# Patient Record
Sex: Male | Born: 1965 | Race: White | Hispanic: No | Marital: Married | Smoking: Never smoker
Health system: Southern US, Community
[De-identification: ages and names within clinical notes are randomized; demographics above are authoritative.]

## PROBLEM LIST (undated history)

## (undated) DIAGNOSIS — E785 Hyperlipidemia, unspecified: Secondary | ICD-10-CM

## (undated) DIAGNOSIS — I2510 Atherosclerotic heart disease of native coronary artery without angina pectoris: Secondary | ICD-10-CM

## (undated) DIAGNOSIS — N400 Enlarged prostate without lower urinary tract symptoms: Secondary | ICD-10-CM

## (undated) DIAGNOSIS — Z8489 Family history of other specified conditions: Secondary | ICD-10-CM

## (undated) DIAGNOSIS — L309 Dermatitis, unspecified: Secondary | ICD-10-CM

## (undated) DIAGNOSIS — F329 Major depressive disorder, single episode, unspecified: Secondary | ICD-10-CM

## (undated) DIAGNOSIS — F419 Anxiety disorder, unspecified: Secondary | ICD-10-CM

## (undated) DIAGNOSIS — N419 Inflammatory disease of prostate, unspecified: Secondary | ICD-10-CM

## (undated) DIAGNOSIS — K219 Gastro-esophageal reflux disease without esophagitis: Secondary | ICD-10-CM

## (undated) DIAGNOSIS — F32A Depression, unspecified: Secondary | ICD-10-CM

## (undated) DIAGNOSIS — J45909 Unspecified asthma, uncomplicated: Secondary | ICD-10-CM

## (undated) HISTORY — PX: CORONARY ANGIOPLASTY: SHX604

## (undated) HISTORY — DX: Unspecified asthma, uncomplicated: J45.909

## (undated) HISTORY — PX: CORONARY ANGIOPLASTY WITH STENT PLACEMENT: SHX49

## (undated) HISTORY — PX: VASECTOMY: SHX75

## (undated) HISTORY — PX: WISDOM TOOTH EXTRACTION: SHX21

## (undated) HISTORY — PX: ESOPHAGOGASTRODUODENOSCOPY: SHX1529

---

## 2013-08-15 ENCOUNTER — Emergency Department (HOSPITAL_BASED_OUTPATIENT_CLINIC_OR_DEPARTMENT_OTHER): Payer: Managed Care, Other (non HMO)

## 2013-08-15 ENCOUNTER — Emergency Department (HOSPITAL_BASED_OUTPATIENT_CLINIC_OR_DEPARTMENT_OTHER)
Admission: EM | Admit: 2013-08-15 | Discharge: 2013-08-16 | Disposition: A | Discharge status: Home / Self Care | Payer: Managed Care, Other (non HMO) | Attending: Emergency Medicine | Admitting: Emergency Medicine

## 2013-08-15 ENCOUNTER — Encounter (HOSPITAL_BASED_OUTPATIENT_CLINIC_OR_DEPARTMENT_OTHER): Payer: Self-pay | Admitting: Emergency Medicine

## 2013-08-15 DIAGNOSIS — R109 Unspecified abdominal pain: Secondary | ICD-10-CM

## 2013-08-15 DIAGNOSIS — IMO0001 Reserved for inherently not codable concepts without codable children: Secondary | ICD-10-CM | POA: Insufficient documentation

## 2013-08-15 DIAGNOSIS — Z87448 Personal history of other diseases of urinary system: Secondary | ICD-10-CM | POA: Insufficient documentation

## 2013-08-15 DIAGNOSIS — E785 Hyperlipidemia, unspecified: Secondary | ICD-10-CM | POA: Insufficient documentation

## 2013-08-15 DIAGNOSIS — Z7982 Long term (current) use of aspirin: Secondary | ICD-10-CM | POA: Insufficient documentation

## 2013-08-15 DIAGNOSIS — M7918 Myalgia, other site: Secondary | ICD-10-CM

## 2013-08-15 DIAGNOSIS — Z79899 Other long term (current) drug therapy: Secondary | ICD-10-CM | POA: Insufficient documentation

## 2013-08-15 HISTORY — DX: Benign prostatic hyperplasia without lower urinary tract symptoms: N40.0

## 2013-08-15 HISTORY — DX: Hyperlipidemia, unspecified: E78.5

## 2013-08-15 LAB — URINALYSIS, ROUTINE W REFLEX MICROSCOPIC
Glucose, UA: NEGATIVE mg/dL
Ketones, ur: NEGATIVE mg/dL
Leukocytes, UA: NEGATIVE
Nitrite: NEGATIVE
Protein, ur: NEGATIVE mg/dL
Specific Gravity, Urine: 1.007 (ref 1.005–1.030)
pH: 7 (ref 5.0–8.0)

## 2013-08-15 MED ORDER — SODIUM CHLORIDE 0.9 % IV BOLUS (SEPSIS)
1000.0000 mL | Freq: Once | INTRAVENOUS | Status: AC
  Administered 2013-08-15: 1000 mL via INTRAVENOUS

## 2013-08-15 MED ORDER — MORPHINE SULFATE 4 MG/ML IJ SOLN
4.0000 mg | Freq: Once | INTRAMUSCULAR | Status: AC
  Administered 2013-08-16: 4 mg via INTRAVENOUS
  Filled 2013-08-15: qty 1

## 2013-08-15 NOTE — ED Notes (Signed)
Pt c/o right flank and right sided back pain x 1 week. Pt also reports "a bulge" on right side of abd.

## 2013-08-15 NOTE — ED Provider Notes (Addendum)
CSN: [id number]     Arrival date & time 08/15/13  2153 History  HPI Comments: This chart was scribed for Ryan Kaplan, MD by Clydene Laming, ED Scribe. This patient was seen in room MH07/MH07 and the patient's care was started at 11:34 PM.  Chief Complaint  Patient presents with  . Flank Pain  . Abdominal Pain    The history is provided by the patient. No language interpreter was used.   HPI Comments: Ryan Baker is a 47 y.o. male who presents to the Emergency Department complaining of back pain with associated flank pain onset one week ago. The pain is right sided ,constant x few days, with no association to food intake. He reports a bulge on the right side of the abdomen starting yday. Pt denies any previous medical problems. No uti like sx, no hx of renal stones, abd surgeries. No associated numbness, weakness, urinary incontinence, urinary retention, bowel incontinence, saddle anesthesia, does have some right buttock tingling.    Past Medical History  Diagnosis Date  . Enlarged prostate   . Hyperlipemia    Past Surgical History  Procedure Laterality Date  . Coronary angioplasty with stent placement    . Vasectomy     No family history on file. History  Substance Use Topics  . Smoking status: Never Smoker   . Smokeless tobacco: Not on file  . Alcohol Use: Yes    Review of Systems  Constitutional: Negative for fever and chills.  Gastrointestinal: Positive for abdominal pain.  All other systems reviewed and are negative.    Allergies  Flomax  Home Medications   Current Outpatient Rx  Name  Route  Sig  Dispense  Refill  . aspirin 81 MG tablet   Oral   Take 81 mg by mouth daily.         . clonazePAM (KLONOPIN) 1 MG tablet   Oral   Take 1 mg by mouth 2 (two) times daily as needed for anxiety.         . cyclobenzaprine (FLEXERIL) 10 MG tablet   Oral   Take 10 mg by mouth 3 (three) times daily as needed for muscle spasms.         Marland Kitchen lamoTRIgine (LAMICTAL) 100  MG tablet   Oral   Take 125 mg by mouth daily.         . rosuvastatin (CRESTOR) 5 MG tablet   Oral   Take 5 mg by mouth daily.         . traMADol (ULTRAM) 50 MG tablet   Oral   Take 50 mg by mouth every 6 (six) hours as needed for pain.          Triage Vitals: BP 148/88  Pulse 68  Temp(Src) 99.2 F (37.3 C) (Oral)  Resp 18  Ht 5\' 9"  (1.753 m)  Wt 158 lb (71.668 kg)  BMI 23.32 kg/m2  SpO2 100% Physical Exam  Nursing note and vitals reviewed. Constitutional: He appears well-developed and well-nourished.  HENT:  Head: Normocephalic and atraumatic.  Mouth/Throat: Oropharynx is clear and moist.  Eyes: EOM are normal. Pupils are equal, round, and reactive to light.  Neck: Normal range of motion. Neck supple.  Cardiovascular: Normal rate, regular rhythm and normal heart sounds.   Pulmonary/Chest: Effort normal and breath sounds normal. He has no wheezes.  Abdominal: There is tenderness (Mild to palpation). There is no rebound and no guarding.  No flank tenderness with paplpation Mild protrusion to right side below  the umbilical region  Genitourinary: Penis normal.  Pt has no testicular tenderness, mass, no signs of hernia.  Musculoskeletal: Normal range of motion.  No midline spine tenderness   Neurological: He is alert.  Skin: Skin is warm and dry. No rash noted. No erythema.  No sign of hernia  Psychiatric: He has a normal mood and affect. His behavior is normal.    ED Course  Procedures (including critical care time) DIAGNOSTIC STUDIES: Oxygen Saturation is 100% on RA, normal by my interpretation.    COORDINATION OF CARE: 11;40 PM- Discussed treatment plan with pt at bedside. Pt verbalized understanding and agreement with plan.   Labs Review Labs Reviewed  URINALYSIS, ROUTINE W REFLEX MICROSCOPIC   Imaging Review No results found.  MDM  No diagnosis found. I personally performed the services described in this documentation, which was scribed in my  presence. The recorded information has been reviewed and is accurate.  Pt comes in with cc of abd pain, and bulging. He is being treated as musculoskeletal pain. Pain has persistent, now he has a bulge- with no clear signs of hernia to the right side. Could be partial muscle tear, but i am not sure. We will get basic labs and CT - which if notmal, he is to continue his PT.  The GU exam is normal. The back exam is normal, no concerns for even sciatica.  1:58 AM CT normal  -will d.c.  Ryan Kaplan, MD 08/16/13 1610  Ryan Kaplan, MD 08/16/13 0201

## 2013-08-15 NOTE — ED Notes (Signed)
MD at bedside. 

## 2013-08-16 LAB — CBC WITH DIFFERENTIAL/PLATELET
Basophils Relative: 0 % (ref 0–1)
HCT: 41.7 % (ref 39.0–52.0)
Hemoglobin: 15.4 g/dL (ref 13.0–17.0)
Lymphocytes Relative: 32 % (ref 12–46)
Lymphs Abs: 2.7 10*3/uL (ref 0.7–4.0)
MCHC: 36.9 g/dL — ABNORMAL HIGH (ref 30.0–36.0)
Monocytes Absolute: 1 10*3/uL (ref 0.1–1.0)
Monocytes Relative: 11 % (ref 3–12)
Neutro Abs: 4.7 10*3/uL (ref 1.7–7.7)
Neutrophils Relative %: 56 % (ref 43–77)
Platelets: 219 10*3/uL (ref 150–400)
RBC: 4.79 MIL/uL (ref 4.22–5.81)
WBC: 8.4 10*3/uL (ref 4.0–10.5)

## 2013-08-16 LAB — BASIC METABOLIC PANEL
BUN: 14 mg/dL (ref 6–23)
CO2: 30 mEq/L (ref 19–32)
Chloride: 93 mEq/L — ABNORMAL LOW (ref 96–112)
Creatinine, Ser: 1 mg/dL (ref 0.50–1.35)
GFR calc Af Amer: >90 mL/min (ref >90)
GFR calc non Af Amer: 87 mL/min — ABNORMAL LOW (ref >90)
Glucose, Bld: 95 mg/dL (ref 70–99)
Potassium: 4.1 mEq/L (ref 3.5–5.1)

## 2013-08-16 MED ORDER — IOHEXOL 300 MG/ML  SOLN
100.0000 mL | Freq: Once | INTRAMUSCULAR | Status: AC | PRN
  Administered 2013-08-16: 100 mL via INTRAVENOUS

## 2013-08-16 MED ORDER — IOHEXOL 300 MG/ML  SOLN
50.0000 mL | Freq: Once | INTRAMUSCULAR | Status: AC | PRN
  Administered 2013-08-16: 50 mL via ORAL

## 2013-08-16 MED ORDER — IBUPROFEN 600 MG PO TABS: 600.0000 mg | ORAL_TABLET | Freq: Four times a day (QID) | ORAL | Status: AC | PRN

## 2013-08-16 NOTE — ED Notes (Signed)
MD at bedside discussing test results and plan of care for DC. 

## 2013-08-16 NOTE — ED Notes (Signed)
Patient transported to CT 

## 2013-11-13 DIAGNOSIS — M259 Joint disorder, unspecified: Secondary | ICD-10-CM | POA: Insufficient documentation

## 2014-03-26 DIAGNOSIS — J45901 Unspecified asthma with (acute) exacerbation: Secondary | ICD-10-CM | POA: Insufficient documentation

## 2014-05-14 DIAGNOSIS — G4700 Insomnia, unspecified: Secondary | ICD-10-CM | POA: Insufficient documentation

## 2014-06-09 DIAGNOSIS — K5900 Constipation, unspecified: Secondary | ICD-10-CM | POA: Insufficient documentation

## 2014-06-09 DIAGNOSIS — M75100 Unspecified rotator cuff tear or rupture of unspecified shoulder, not specified as traumatic: Secondary | ICD-10-CM | POA: Insufficient documentation

## 2014-06-09 DIAGNOSIS — I2510 Atherosclerotic heart disease of native coronary artery without angina pectoris: Secondary | ICD-10-CM | POA: Insufficient documentation

## 2014-06-09 DIAGNOSIS — R0602 Shortness of breath: Secondary | ICD-10-CM | POA: Insufficient documentation

## 2014-06-09 DIAGNOSIS — F419 Anxiety disorder, unspecified: Secondary | ICD-10-CM | POA: Insufficient documentation

## 2014-06-09 DIAGNOSIS — R5383 Other fatigue: Secondary | ICD-10-CM | POA: Insufficient documentation

## 2014-06-09 DIAGNOSIS — N401 Enlarged prostate with lower urinary tract symptoms: Secondary | ICD-10-CM | POA: Insufficient documentation

## 2014-06-09 DIAGNOSIS — F329 Major depressive disorder, single episode, unspecified: Secondary | ICD-10-CM | POA: Insufficient documentation

## 2014-06-09 DIAGNOSIS — E785 Hyperlipidemia, unspecified: Secondary | ICD-10-CM | POA: Insufficient documentation

## 2014-06-09 DIAGNOSIS — F988 Other specified behavioral and emotional disorders with onset usually occurring in childhood and adolescence: Secondary | ICD-10-CM | POA: Insufficient documentation

## 2014-06-09 DIAGNOSIS — R350 Frequency of micturition: Secondary | ICD-10-CM | POA: Insufficient documentation

## 2014-06-09 DIAGNOSIS — F411 Generalized anxiety disorder: Secondary | ICD-10-CM | POA: Insufficient documentation

## 2014-06-09 DIAGNOSIS — N400 Enlarged prostate without lower urinary tract symptoms: Secondary | ICD-10-CM | POA: Insufficient documentation

## 2014-06-09 DIAGNOSIS — B351 Tinea unguium: Secondary | ICD-10-CM | POA: Insufficient documentation

## 2014-06-09 DIAGNOSIS — J309 Allergic rhinitis, unspecified: Secondary | ICD-10-CM | POA: Insufficient documentation

## 2014-06-09 DIAGNOSIS — G9001 Carotid sinus syncope: Secondary | ICD-10-CM | POA: Insufficient documentation

## 2014-06-09 DIAGNOSIS — E291 Testicular hypofunction: Secondary | ICD-10-CM | POA: Insufficient documentation

## 2014-06-09 DIAGNOSIS — K219 Gastro-esophageal reflux disease without esophagitis: Secondary | ICD-10-CM | POA: Insufficient documentation

## 2014-08-07 NOTE — H&P (Signed)
  Ryan Baker is an 48 y.o. male.    Chief Complaint: left shoulder  HPI: Pt is a 48 y.o. male complaining of left shoulder pain for multiple years. Pain had continually increased since the beginning. X-rays in the clinic show end-stage arthritic changes of the left AC joint. Pt has tried various conservative treatments which have failed to alleviate their symptoms, including medications and therapy. Various options are discussed with the patient. Risks, benefits and expectations were discussed with the patient. Patient understand the risks, benefits and expectations and wishes to proceed with surgery.   PCP:  No primary provider on file.  D/C Plans:  Home  PMH: No past medical history on file.  PSH: No past surgical history on file.  Social History:  has no tobacco, alcohol, and drug history on file.  Allergies:  Allergies not on file  Medications: No current facility-administered medications for this encounter.   No current outpatient prescriptions on file.    No results found for this or any previous visit (from the past 48 hour(s)). No results found.  ROS: Pain with rom of the left upper extremity  Physical Exam:  Alert and oriented 48 y.o. male in no acute distress Cranial nerves 2-12 intact Cervical spine: full rom with no tenderness, nv intact distally Chest: active breath sounds bilaterally, no wheeze rhonchi or rales Heart: regular rate and rhythm, no murmur Abd: non tender non distended with active bowel sounds Hip is stable with rom  Left shoulder with moderate AC joint tenderness Strength of ER and IR 5/5 bilaterally No rashes or edema Good rom otherwise  Assessment/Plan Assessment: left shoulder AC arthrosis  Plan: Patient will undergo a left shoulder scope with open distal clavicle excision by Dr. Ranell Patrick at [facility]. Risks benefits and expectations were discussed with the patient. Patient understand risks, benefits and expectations and wishes  to proceed.

## 2014-08-14 ENCOUNTER — Encounter (HOSPITAL_COMMUNITY)
Admission: RE | Admit: 2014-08-14 | Discharge: 2014-08-14 | Disposition: A | Discharge status: Home / Self Care | Payer: Managed Care, Other (non HMO) | Source: Ambulatory Visit | Attending: Orthopedic Surgery | Admitting: Orthopedic Surgery

## 2014-08-14 ENCOUNTER — Encounter (HOSPITAL_COMMUNITY): Payer: Self-pay

## 2014-08-14 ENCOUNTER — Encounter (HOSPITAL_COMMUNITY): Payer: Self-pay | Admitting: Pharmacy Technician

## 2014-08-14 ENCOUNTER — Ambulatory Visit (HOSPITAL_COMMUNITY)
Admission: RE | Admit: 2014-08-14 | Discharge: 2014-08-14 | Disposition: A | Discharge status: Home / Self Care | Payer: Managed Care, Other (non HMO) | Source: Ambulatory Visit | Attending: Anesthesiology | Admitting: Anesthesiology

## 2014-08-14 DIAGNOSIS — Z01818 Encounter for other preprocedural examination: Secondary | ICD-10-CM

## 2014-08-14 DIAGNOSIS — M25819 Other specified joint disorders, unspecified shoulder: Secondary | ICD-10-CM | POA: Diagnosis not present

## 2014-08-14 DIAGNOSIS — I2510 Atherosclerotic heart disease of native coronary artery without angina pectoris: Secondary | ICD-10-CM | POA: Diagnosis not present

## 2014-08-14 DIAGNOSIS — M19019 Primary osteoarthritis, unspecified shoulder: Secondary | ICD-10-CM | POA: Diagnosis not present

## 2014-08-14 HISTORY — DX: Family history of other specified conditions: Z84.89

## 2014-08-14 HISTORY — DX: Gastro-esophageal reflux disease without esophagitis: K21.9

## 2014-08-14 HISTORY — DX: Anxiety disorder, unspecified: F41.9

## 2014-08-14 HISTORY — DX: Atherosclerotic heart disease of native coronary artery without angina pectoris: I25.10

## 2014-08-14 HISTORY — DX: Major depressive disorder, single episode, unspecified: F32.9

## 2014-08-14 HISTORY — DX: Inflammatory disease of prostate, unspecified: N41.9

## 2014-08-14 HISTORY — DX: Dermatitis, unspecified: L30.9

## 2014-08-14 HISTORY — DX: Depression, unspecified: F32.A

## 2014-08-14 LAB — CBC
HEMATOCRIT: 39.7 % (ref 39.0–52.0)
HEMOGLOBIN: 14.4 g/dL (ref 13.0–17.0)
MCH: 31.8 pg (ref 26.0–34.0)
MCHC: 36.3 g/dL — AB (ref 30.0–36.0)
MCV: 87.6 fL (ref 78.0–100.0)
Platelets: 184 10*3/uL (ref 150–400)
RBC: 4.53 MIL/uL (ref 4.22–5.81)
RDW: 12 % (ref 11.5–15.5)
WBC: 5.6 10*3/uL (ref 4.0–10.5)

## 2014-08-14 LAB — BASIC METABOLIC PANEL
ANION GAP: 11 (ref 5–15)
BUN: 10 mg/dL (ref 6–23)
CO2: 26 meq/L (ref 19–32)
CREATININE: 0.94 mg/dL (ref 0.50–1.35)
Calcium: 9.4 mg/dL (ref 8.4–10.5)
Chloride: 100 mEq/L (ref 96–112)
GFR calc non Af Amer: >90 mL/min (ref >90)
Glucose, Bld: 108 mg/dL — ABNORMAL HIGH (ref 70–99)
POTASSIUM: 3.8 meq/L (ref 3.7–5.3)
SODIUM: 137 meq/L (ref 137–147)

## 2014-08-14 MED ORDER — CEFAZOLIN SODIUM-DEXTROSE 2-3 GM-% IV SOLR
2.0000 g | INTRAVENOUS | Status: AC
  Administered 2014-08-15: 2 g via INTRAVENOUS
  Filled 2014-08-14: qty 50

## 2014-08-14 NOTE — Pre-Procedure Instructions (Signed)
Ryan Baker  08/14/2014   Your procedure is scheduled on: Friday, August 15, 2014  Report to [facility] Admitting at 11:30 AM.  Call this number if you have problems the morning of surgery: [phone]   Remember:   Do not eat food or drink liquids after midnight.   Take these medicines the morning of surgery with A SIP OF WATER: fexofenadine (ALLEGRA),  mometasone (NASONEX) nasal spray, if needed: alfuzosin (UROXATRAL) for symptoms of prostatitis Stop taking Aspirin, vitamins, and herbal medications. Do not take any NSAIDs ie: Ibuprofen, Advil, Naproxen or any medication containing Aspirin.  Do not wear jewelry, make-up or nail polish.  Do not wear lotions, powders, or perfumes. You may not wear deodorant.  Do not shave 48 hours prior to surgery. Men may shave face and neck.  Do not bring valuables to the hospital.  [facility] is not responsible for any belongings or valuables.               Contacts, dentures or bridgework may not be worn into surgery.  Leave suitcase in the car. After surgery it may be brought to your room.  For patients admitted to the hospital, discharge time is determined by your treatment team.               Patients discharged the day of surgery will not be allowed to drive home.  Name and phone number of your driver:   Special Instructions:  Special Instructions:Special Instructions: [facility] - Preparing for Surgery  Before surgery, you can play an important role.  Because skin is not sterile, your skin needs to be as free of germs as possible.  You can reduce the number of germs on you skin by washing with CHG (chlorahexidine gluconate) soap before surgery.  CHG is an antiseptic cleaner which kills germs and bonds with the skin to continue killing germs even after washing.  Please DO NOT use if you have an allergy to CHG or antibacterial soaps.  If your skin becomes reddened/irritated stop using the CHG and inform your nurse when you  arrive at Short Stay.  Do not shave (including legs and underarms) for at least 48 hours prior to the first CHG shower.  You may shave your face.  Please follow these instructions carefully:   1.  Shower with CHG Soap the night before surgery and the morning of Surgery.  2.  If you choose to wash your hair, wash your hair first as usual with your normal shampoo.  3.  After you shampoo, rinse your hair and body thoroughly to remove the Shampoo.  4.  Use CHG as you would any other liquid soap.  You can apply chg directly  to the skin and wash gently with scrungie or a clean washcloth.  5.  Apply the CHG Soap to your body ONLY FROM THE NECK DOWN.  Do not use on open wounds or open sores.  Avoid contact with your eyes, ears, mouth and genitals (private parts).  Wash genitals (private parts) with your normal soap.  6.  Wash thoroughly, paying special attention to the area where your surgery will be performed.  7.  Thoroughly rinse your body with warm water from the neck down.  8.  DO NOT shower/wash with your normal soap after using and rinsing off the CHG Soap.  9.  Pat yourself dry with a clean towel.            10.  Wear  clean pajamas.            11.  Place clean sheets on your bed the night of your first shower and do not sleep with pets.  Day of Surgery  Do not apply any lotions/deodorants the morning of surgery.  Please wear clean clothes to the hospital/surgery center.   Please read over the following fact sheets that you were given: Pain Booklet, Coughing and Deep Breathing and Surgical Site Infection Prevention

## 2014-08-14 NOTE — Progress Notes (Signed)
Pt denies SOB and chest pain. Pt stated that he had an EKG, stress and echo this summer and a cardiac cath in the previous year. Records requested from [facility], Dr. Rhona Leavens. Pt also stated that he suffers from hypoglycemia and dizziness if he goes without eating for a long period of time. Pt wanted to see if MD would make a special provision regarding his diet. Pt instructed to be NPO after MN. Spoke with Donia Guiles at surgeons office to make MD aware of pt request.

## 2014-08-15 ENCOUNTER — Encounter (HOSPITAL_COMMUNITY): Payer: Managed Care, Other (non HMO) | Admitting: Emergency Medicine

## 2014-08-15 ENCOUNTER — Encounter (HOSPITAL_COMMUNITY)
Admission: RE | Disposition: A | Discharge status: Home / Self Care | Payer: Self-pay | Source: Ambulatory Visit | Attending: Orthopedic Surgery

## 2014-08-15 ENCOUNTER — Encounter (HOSPITAL_COMMUNITY): Payer: Self-pay | Admitting: *Deleted

## 2014-08-15 ENCOUNTER — Ambulatory Visit (HOSPITAL_COMMUNITY): Payer: Managed Care, Other (non HMO) | Admitting: Anesthesiology

## 2014-08-15 ENCOUNTER — Ambulatory Visit (HOSPITAL_COMMUNITY)
Admission: RE | Admit: 2014-08-15 | Discharge: 2014-08-15 | Disposition: A | Discharge status: Home / Self Care | Payer: Managed Care, Other (non HMO) | Source: Ambulatory Visit | Attending: Orthopedic Surgery | Admitting: Orthopedic Surgery

## 2014-08-15 DIAGNOSIS — M19019 Primary osteoarthritis, unspecified shoulder: Secondary | ICD-10-CM | POA: Diagnosis not present

## 2014-08-15 DIAGNOSIS — M7580 Other shoulder lesions, unspecified shoulder: Secondary | ICD-10-CM

## 2014-08-15 DIAGNOSIS — M25819 Other specified joint disorders, unspecified shoulder: Secondary | ICD-10-CM | POA: Insufficient documentation

## 2014-08-15 DIAGNOSIS — I2510 Atherosclerotic heart disease of native coronary artery without angina pectoris: Secondary | ICD-10-CM | POA: Insufficient documentation

## 2014-08-15 HISTORY — PX: SHOULDER ARTHROSCOPY: SHX128

## 2014-08-15 SURGERY — ARTHROSCOPY, SHOULDER
Anesthesia: General | Site: Shoulder | Laterality: Left

## 2014-08-15 MED ORDER — FENTANYL CITRATE 0.05 MG/ML IJ SOLN
INTRAMUSCULAR | Status: AC
  Administered 2014-08-15: 100 ug via INTRAVENOUS
  Filled 2014-08-15: qty 2

## 2014-08-15 MED ORDER — METHOCARBAMOL 500 MG PO TABS: 500.0000 mg | ORAL_TABLET | Freq: Three times a day (TID) | ORAL | Status: DC | PRN

## 2014-08-15 MED ORDER — MIDAZOLAM HCL 2 MG/2ML IJ SOLN
INTRAMUSCULAR | Status: AC
  Filled 2014-08-15: qty 2

## 2014-08-15 MED ORDER — LIDOCAINE HCL (CARDIAC) 20 MG/ML IV SOLN
INTRAVENOUS | Status: AC
  Filled 2014-08-15: qty 5

## 2014-08-15 MED ORDER — GLYCOPYRROLATE 0.2 MG/ML IJ SOLN
INTRAMUSCULAR | Status: AC
  Filled 2014-08-15: qty 2

## 2014-08-15 MED ORDER — FENTANYL CITRATE 0.05 MG/ML IJ SOLN
INTRAMUSCULAR | Status: DC | PRN
  Administered 2014-08-15: 125 ug via INTRAVENOUS

## 2014-08-15 MED ORDER — NEOSTIGMINE METHYLSULFATE 10 MG/10ML IV SOLN
INTRAVENOUS | Status: DC | PRN
  Administered 2014-08-15: 2 mg via INTRAVENOUS

## 2014-08-15 MED ORDER — LIDOCAINE HCL (CARDIAC) 20 MG/ML IV SOLN
INTRAVENOUS | Status: DC | PRN
  Administered 2014-08-15: 20 mg via INTRAVENOUS

## 2014-08-15 MED ORDER — SUCCINYLCHOLINE CHLORIDE 20 MG/ML IJ SOLN
INTRAMUSCULAR | Status: DC | PRN
  Administered 2014-08-15: 60 mg via INTRAVENOUS

## 2014-08-15 MED ORDER — SCOPOLAMINE 1 MG/3DAYS TD PT72
1.0000 | MEDICATED_PATCH | TRANSDERMAL | Status: DC
  Administered 2014-08-15: 1.5 mg via TRANSDERMAL
  Filled 2014-08-15: qty 1

## 2014-08-15 MED ORDER — SODIUM CHLORIDE 0.9 % IR SOLN
Status: DC | PRN
  Administered 2014-08-15: 1000 mL
  Administered 2014-08-15 (×2): 3000 mL

## 2014-08-15 MED ORDER — PROPOFOL 10 MG/ML IV BOLUS
INTRAVENOUS | Status: AC
Start: 2014-08-15 — End: 2014-08-15
  Filled 2014-08-15: qty 20

## 2014-08-15 MED ORDER — OXYCODONE-ACETAMINOPHEN 5-325 MG PO TABS: 1.0000 | ORAL_TABLET | ORAL | Status: DC | PRN

## 2014-08-15 MED ORDER — ONDANSETRON HCL 4 MG/2ML IJ SOLN
INTRAMUSCULAR | Status: AC
  Filled 2014-08-15: qty 2

## 2014-08-15 MED ORDER — PHENYLEPHRINE HCL 10 MG/ML IJ SOLN
INTRAMUSCULAR | Status: AC
  Filled 2014-08-15: qty 1

## 2014-08-15 MED ORDER — KETOROLAC TROMETHAMINE 30 MG/ML IJ SOLN
30.0000 mg | Freq: Once | INTRAMUSCULAR | Status: AC
  Administered 2014-08-15: 30 mg via INTRAVENOUS

## 2014-08-15 MED ORDER — BUPIVACAINE-EPINEPHRINE 0.25% -1:200000 IJ SOLN
INTRAMUSCULAR | Status: DC | PRN
  Administered 2014-08-15: 5 mL

## 2014-08-15 MED ORDER — CHLORHEXIDINE GLUCONATE 4 % EX LIQD
60.0000 mL | Freq: Once | CUTANEOUS | Status: DC
  Filled 2014-08-15: qty 60

## 2014-08-15 MED ORDER — KETOROLAC TROMETHAMINE 30 MG/ML IJ SOLN
INTRAMUSCULAR | Status: AC
  Filled 2014-08-15: qty 1

## 2014-08-15 MED ORDER — ROCURONIUM BROMIDE 100 MG/10ML IV SOLN
INTRAVENOUS | Status: DC | PRN
  Administered 2014-08-15: 10 mg via INTRAVENOUS

## 2014-08-15 MED ORDER — FENTANYL CITRATE 0.05 MG/ML IJ SOLN
100.0000 ug | Freq: Once | INTRAMUSCULAR | Status: AC
  Administered 2014-08-15: 100 ug via INTRAVENOUS

## 2014-08-15 MED ORDER — FENTANYL CITRATE 0.05 MG/ML IJ SOLN
INTRAMUSCULAR | Status: AC
  Filled 2014-08-15: qty 5

## 2014-08-15 MED ORDER — BUPIVACAINE-EPINEPHRINE (PF) 0.25% -1:200000 IJ SOLN
INTRAMUSCULAR | Status: AC
  Filled 2014-08-15: qty 30

## 2014-08-15 MED ORDER — ONDANSETRON HCL 4 MG/2ML IJ SOLN: 4.0000 mg | Freq: Once | INTRAMUSCULAR | Status: DC | PRN

## 2014-08-15 MED ORDER — LACTATED RINGERS IV SOLN
Freq: Once | INTRAVENOUS | Status: AC
  Administered 2014-08-15: 13:00:00 via INTRAVENOUS

## 2014-08-15 MED ORDER — PROPOFOL 10 MG/ML IV BOLUS
INTRAVENOUS | Status: DC | PRN
  Administered 2014-08-15: 200 mg via INTRAVENOUS

## 2014-08-15 MED ORDER — MIDAZOLAM HCL 5 MG/5ML IJ SOLN
INTRAMUSCULAR | Status: DC | PRN
  Administered 2014-08-15: 2 mg via INTRAVENOUS

## 2014-08-15 MED ORDER — LIDOCAINE HCL 4 % MT SOLN
OROMUCOSAL | Status: DC | PRN
  Administered 2014-08-15: 4 mL via TOPICAL

## 2014-08-15 MED ORDER — SODIUM CHLORIDE 0.9 % IV SOLN
10.0000 mg | INTRAVENOUS | Status: DC | PRN
  Administered 2014-08-15: 10 ug/min via INTRAVENOUS

## 2014-08-15 MED ORDER — LACTATED RINGERS IV SOLN
INTRAVENOUS | Status: DC | PRN
  Administered 2014-08-15: 13:00:00 via INTRAVENOUS

## 2014-08-15 MED ORDER — GLYCOPYRROLATE 0.2 MG/ML IJ SOLN
INTRAMUSCULAR | Status: DC | PRN
  Administered 2014-08-15: 0.4 mg via INTRAVENOUS

## 2014-08-15 MED ORDER — ONDANSETRON HCL 4 MG/2ML IJ SOLN
INTRAMUSCULAR | Status: DC | PRN
  Administered 2014-08-15: 4 mg via INTRAVENOUS

## 2014-08-15 MED ORDER — HYDROMORPHONE HCL 1 MG/ML IJ SOLN: 0.2500 mg | INTRAMUSCULAR | Status: DC | PRN

## 2014-08-15 SURGICAL SUPPLY — 52 items
BLADE CUDA 4.2 (BLADE) ×3 IMPLANT
BLADE SURG 11 STRL SS (BLADE) ×3 IMPLANT
BUR OVAL 4.0 (BURR) IMPLANT
CLOSURE STERI-STRIP 1/2X4 (GAUZE/BANDAGES/DRESSINGS) ×1
CLOSURE WOUND 1/2 X4 (GAUZE/BANDAGES/DRESSINGS) ×1
CLSR STERI-STRIP ANTIMIC 1/2X4 (GAUZE/BANDAGES/DRESSINGS) ×2 IMPLANT
COVER SURGICAL LIGHT HANDLE (MISCELLANEOUS) ×3 IMPLANT
DRAPE INCISE IOBAN 66X45 STRL (DRAPES) ×3 IMPLANT
DRAPE STERI 35X30 U-POUCH (DRAPES) ×3 IMPLANT
DRAPE U-SHAPE 47X51 STRL (DRAPES) ×3 IMPLANT
DRSG ADAPTIC 3X8 NADH LF (GAUZE/BANDAGES/DRESSINGS) ×3 IMPLANT
DRSG EMULSION OIL 3X3 NADH (GAUZE/BANDAGES/DRESSINGS) ×6 IMPLANT
DRSG PAD ABDOMINAL 8X10 ST (GAUZE/BANDAGES/DRESSINGS) ×3 IMPLANT
DURAPREP 26ML APPLICATOR (WOUND CARE) ×3 IMPLANT
ELECT REM PT RETURN 9FT ADLT (ELECTROSURGICAL)
ELECTRODE REM PT RTRN 9FT ADLT (ELECTROSURGICAL) IMPLANT
GAUZE SPONGE 4X4 12PLY STRL (GAUZE/BANDAGES/DRESSINGS) ×3 IMPLANT
GLOVE BIOGEL PI ORTHO PRO 7.5 (GLOVE) ×2
GLOVE BIOGEL PI ORTHO PRO SZ8 (GLOVE) ×2
GLOVE ORTHO TXT STRL SZ7.5 (GLOVE) ×3 IMPLANT
GLOVE PI ORTHO PRO STRL 7.5 (GLOVE) ×1 IMPLANT
GLOVE PI ORTHO PRO STRL SZ8 (GLOVE) ×1 IMPLANT
GLOVE SURG ORTHO 8.5 STRL (GLOVE) ×3 IMPLANT
GOWN STRL REUS W/ TWL XL LVL3 (GOWN DISPOSABLE) ×4 IMPLANT
GOWN STRL REUS W/TWL XL LVL3 (GOWN DISPOSABLE) ×8
KIT BASIN OR (CUSTOM PROCEDURE TRAY) ×3 IMPLANT
KIT ROOM TURNOVER OR (KITS) ×3 IMPLANT
MANIFOLD NEPTUNE II (INSTRUMENTS) ×3 IMPLANT
NEEDLE HYPO 25GX1X1/2 BEV (NEEDLE) ×3 IMPLANT
NEEDLE SPNL 18GX3.5 QUINCKE PK (NEEDLE) ×3 IMPLANT
NS IRRIG 1000ML POUR BTL (IV SOLUTION) ×3 IMPLANT
PACK SHOULDER (CUSTOM PROCEDURE TRAY) ×3 IMPLANT
PAD ARMBOARD 7.5X6 YLW CONV (MISCELLANEOUS) ×6 IMPLANT
RESECTOR FULL RADIUS 4.2MM (BLADE) ×3 IMPLANT
SET ARTHROSCOPY TUBING (MISCELLANEOUS) ×2
SET ARTHROSCOPY TUBING LN (MISCELLANEOUS) ×1 IMPLANT
SLING ARM LRG ADULT FOAM STRAP (SOFTGOODS) ×3 IMPLANT
SLING ARM MED ADULT FOAM STRAP (SOFTGOODS) IMPLANT
SPONGE GAUZE 4X4 12PLY STER LF (GAUZE/BANDAGES/DRESSINGS) ×3 IMPLANT
STRIP CLOSURE SKIN 1/2X4 (GAUZE/BANDAGES/DRESSINGS) ×2 IMPLANT
SUT VIC AB 0 CT2 27 (SUTURE) IMPLANT
SUT VIC AB 2-0 CT1 27 (SUTURE) ×2
SUT VIC AB 2-0 CT1 TAPERPNT 27 (SUTURE) ×1 IMPLANT
SUT VICRYL 0 CT 1 36IN (SUTURE) ×3 IMPLANT
SYR CONTROL 10ML LL (SYRINGE) ×3 IMPLANT
TAPE CLOTH SURG 6X10 WHT LF (GAUZE/BANDAGES/DRESSINGS) ×3 IMPLANT
TOWEL OR 17X24 6PK STRL BLUE (TOWEL DISPOSABLE) ×3 IMPLANT
TOWEL OR 17X26 10 PK STRL BLUE (TOWEL DISPOSABLE) ×3 IMPLANT
TUBE CONNECTING 12'X1/4 (SUCTIONS) ×1
TUBE CONNECTING 12X1/4 (SUCTIONS) ×2 IMPLANT
WAND HAND CNTRL MULTIVAC 90 (MISCELLANEOUS) ×3 IMPLANT
WATER STERILE IRR 1000ML POUR (IV SOLUTION) ×3 IMPLANT

## 2014-08-15 NOTE — Brief Op Note (Signed)
08/15/2014  3:20 PM  PATIENT:  Ryan Baker  48 y.o. male  PRE-OPERATIVE DIAGNOSIS:  LEFT SHOULDER AC DEGENERATIVE JOINT DISEASE  POST-OPERATIVE DIAGNOSIS:  LEFT SHOULDER AC DEGENERATIVE JOINT DISEASE  PROCEDURE:  Procedure(s): LEFT ARTHROSCOPY SHOULDER WITH ACROMIO CLAVICULAR RESECTION (Left) subacromial decompression  SURGEON:  Surgeon(s) and Role:    * Verlee Rossetti, MD - Primary  PHYSICIAN ASSISTANT:   ASSISTANTS: Thea Gist, PA-C   ANESTHESIA:   regional and general  EBL:  Total I/O In: 800 [I.V.:800] Out: -   BLOOD ADMINISTERED:none  DRAINS: none   LOCAL MEDICATIONS USED:  MARCAINE     SPECIMEN:  No Specimen  DISPOSITION OF SPECIMEN:  N/A  COUNTS:  YES  TOURNIQUET:  * No tourniquets in log *  DICTATION: .Other Dictation: Dictation Number [id number]  PLAN OF CARE: discharge home  PATIENT DISPOSITION:  PACU - hemodynamically stable.   Delay start of Pharmacological VTE agent (>24hrs) due to surgical blood loss or risk of bleeding: not applicable

## 2014-08-15 NOTE — Anesthesia Postprocedure Evaluation (Signed)
  Anesthesia Post-op Note  Patient: Ryan Baker  Procedure(s) Performed: Procedure(s): LEFT ARTHROSCOPY SHOULDER WITH ACROMIO CLAVICULAR RESECTION (Left)  Patient Location: PACU  Anesthesia Type:GA combined with regional for post-op pain  Level of Consciousness: awake and alert   Airway and Oxygen Therapy: Patient Spontanous Breathing  Post-op Pain: none  Post-op Assessment: Post-op Vital signs reviewed  Post-op Vital Signs: stable  Last Vitals:  Filed Vitals:   08/15/14 1609  BP: 142/81  Pulse: 80  Temp:   Resp: 15    Complications: No apparent anesthesia complications

## 2014-08-15 NOTE — Discharge Instructions (Signed)
SHOULDER POST OP INSTRUCTIONS  Please ice the shoulder as much as you can over the next two weeks to help with pain and inflammation.  Use sling when out of the home.  Ok to remove the sling in the house, lean to the side with the sling, slide the sling off and slide a pillow under the arm,  hug the pillow. Patients tend to sleep better for the first few weeks semi-upright in a recliner or propped up in bed.  Keep the incision covered with the surgical bandage until Monday then change to Band Aids.  Ok to get wet in the shower next Wednesday.  No heavy pushing pulling or lifting with the left shoulder but it is ok to do light activity as tolerated.  Do exercises every hour while awake to prevent a FROZEN shoulder - lap slides, gentle rotation exercises with elbow to the side or on the pillow, rotate had in to your body (hug yourself), then rotate arm out to your side like you are hitchhiking with the thumb pointed out. Use the right hand to grab the left wrist and lift the arm up overhead - this is easiest reclined.  Also do pendulum exercises.  Start the pain medications after you have eaten WELL BEFORE the block wears off and take scheduled over the next several days - every 4 hours for the pain killer and every 6 hours for the muscle relaxer.  Follow up in two weeks with Dr Ranell Patrick.  Call [phone] for appointment    What to eat:  For your first meals, you should eat lightly; only small meals initially.  If you do not have nausea, you may eat larger meals.  Avoid spicy, greasy and heavy food.    General Anesthesia, Adult, Care After  Refer to this sheet in the next few weeks. These instructions provide you with information on caring for yourself after your procedure. Your health care provider may also give you more specific instructions. Your treatment has been planned according to current medical practices, but problems sometimes occur. Call your health care provider if you have any problems  or questions after your procedure.  WHAT TO EXPECT AFTER THE PROCEDURE  After the procedure, it is typical to experience:  Sleepiness.  Nausea and vomiting. HOME CARE INSTRUCTIONS  For the first 24 hours after general anesthesia:  Have a responsible person with you.  Do not drive a car. If you are alone, do not take public transportation.  Do not drink alcohol.  Do not take medicine that has not been prescribed by your health care provider.  Do not sign important papers or make important decisions.  You may resume a normal diet and activities as directed by your health care provider.  Change bandages (dressings) as directed.  If you have questions or problems that seem related to general anesthesia, call the hospital and ask for the anesthetist or anesthesiologist on call. SEEK MEDICAL CARE IF:  You have nausea and vomiting that continue the day after anesthesia.  You develop a rash. SEEK IMMEDIATE MEDICAL CARE IF:  You have difficulty breathing.  You have chest pain.  You have any allergic problems. Document Released: 02/13/2001 Document Revised: 07/10/2013 Document Reviewed: 05/23/2013  [facility] Patient Information 2014 [place], [place].

## 2014-08-15 NOTE — Anesthesia Preprocedure Evaluation (Addendum)
Anesthesia Evaluation  Patient identified by MRN, date of birth, ID band Patient awake    Reviewed: Allergy & Precautions, H&P , NPO status , Patient's Chart, lab work & pertinent test results  Airway Mallampati: I      Dental  (+) Teeth Intact   Pulmonary          Cardiovascular + CAD     Neuro/Psych    GI/Hepatic GERD-  ,  Endo/Other    Renal/GU      Musculoskeletal   Abdominal   Peds  Hematology   Anesthesia Other Findings   Reproductive/Obstetrics                           Anesthesia Physical Anesthesia Plan  ASA: I  Anesthesia Plan: General   Post-op Pain Management:    Induction: Intravenous  Airway Management Planned: Oral ETT  Additional Equipment:   Intra-op Plan:   Post-operative Plan: Extubation in OR  Informed Consent: I have reviewed the patients History and Physical, chart, labs and discussed the procedure including the risks, benefits and alternatives for the proposed anesthesia with the patient or authorized representative who has indicated his/her understanding and acceptance.   Dental advisory given  Plan Discussed with: CRNA, Anesthesiologist and Surgeon  Anesthesia Plan Comments:        Anesthesia Quick Evaluation

## 2014-08-15 NOTE — Interval H&P Note (Signed)
History and Physical Interval Note:  08/15/2014 1:09 PM  Ryan Baker  has presented today for surgery, with the diagnosis of LEFT SHOULDER [facility] DEGENERATIVE JOINT DISEASE  The various methods of treatment have been discussed with the patient and family. After consideration of risks, benefits and other options for treatment, the patient has consented to  Procedure(s): LEFT ARTHROSCOPY SHOULDER WITH ACROMIO CLAVICULAR RESECTION (Left) as a surgical intervention .  The patient's history has been reviewed, patient examined, no change in status, stable for surgery.  I have reviewed the patient's chart and labs.  Questions were answered to the patient's satisfaction.     Pedrohenrique Mcconville,STEVEN R

## 2014-08-15 NOTE — Transfer of Care (Signed)
Immediate Anesthesia Transfer of Care Note  Patient: Ryan Baker  Procedure(s) Performed: Procedure(s): LEFT ARTHROSCOPY SHOULDER WITH ACROMIO CLAVICULAR RESECTION (Left)  Patient Location: PACU  Anesthesia Type:General  Level of Consciousness: awake, alert  and oriented  Airway & Oxygen Therapy: Patient Spontanous Breathing and Patient connected to nasal cannula oxygen  Post-op Assessment: Report given to PACU RN, Post -op Vital signs reviewed and stable and Patient moving all extremities X 4  Post vital signs: Reviewed and stable  Complications: No apparent anesthesia complications

## 2014-08-15 NOTE — Anesthesia Procedure Notes (Addendum)
Anesthesia Regional Block:  Interscalene brachial plexus block  Pre-Anesthetic Checklist: ,, timeout performed, Correct Patient, Correct Site, Correct Laterality, Correct Procedure, Correct Position, site marked, Risks and benefits discussed,  Surgical consent,  Pre-op evaluation,  At surgeon's request and post-op pain management  Laterality: Left  Prep: Maximum Sterile Barrier Precautions used, chloraprep and alcohol swabs       Needles:  Injection technique: Single-shot  Needle Type: Stimulator Needle - 40        Needle insertion depth: 4 cm   Additional Needles:  Procedures: nerve stimulator Interscalene brachial plexus block  Nerve Stimulator or Paresthesia:  Response: 0.5 mA, 0.1 ms, 4 cm  Additional Responses:   Narrative:  Start time: 08/15/2014 1:10 PM End time: 08/15/2014 1:15 PM Injection made incrementally with aspirations every 5 mL.  Performed by: Personally  Anesthesiologist: gregory e smith md  Additional Notes: Pt accepts procedure w/ risks. 20cc 0.5% Marcaine w/ epi w/o difficulty or discomfort. GES   Procedure Name: Intubation Date/Time: 08/15/2014 1:48 PM Performed by: Sharlene Dory E Pre-anesthesia Checklist: Patient identified, Emergency Drugs available, Suction available, Patient being monitored and Timeout performed Patient Re-evaluated:Patient Re-evaluated prior to inductionOxygen Delivery Method: Circle system utilized Preoxygenation: Pre-oxygenation with 100% oxygen Intubation Type: IV induction Ventilation: Mask ventilation without difficulty Laryngoscope Size: Mac and 3 Grade View: Grade I Tube type: Oral Tube size: 7.5 mm Number of attempts: 1 Airway Equipment and Method: Stylet Placement Confirmation: ETT inserted through vocal cords under direct vision,  positive ETCO2 and breath sounds checked- equal and bilateral Secured at: 22 cm Tube secured with: Tape Dental Injury: Teeth and Oropharynx as per pre-operative assessment   Comments: AOI per Mort Sawyers, paramedic student with Dr. Katrinka Blazing supervising. +ETCO2 & BBS =.

## 2014-08-16 NOTE — Op Note (Signed)
NAME:  Ryan Baker, Ryan Baker               ACCOUNT NO.:  [id number]  MEDICAL RECORD NO.:  [id number]  LOCATION:  [facility]                         FACILITY:  [facility]  PHYSICIAN:  Almedia Balls. Ranell Patrick, M.D. DATE OF BIRTH:  [birth year]  DATE OF PROCEDURE:  08/15/2014 DATE OF DISCHARGE:  08/15/2014                              OPERATIVE REPORT   PREOPERATIVE DIAGNOSIS:  Left shoulder acromioclavicular joint arthritis, symptomatic, and impingement syndrome.  POSTOPERATIVE DIAGNOSIS:  Left shoulder acromioclavicular joint arthritis, symptomatic, and impingement syndrome.  PROCEDURE PERFORMED:  Left shoulder arthroscopy with arthroscopic subacromial decompression followed by open distal clavicle resection.  ATTENDING SURGEON:  Almedia Balls. Ranell Patrick, MD  ASSISTANT:  Donnie Coffin. Dixon, PA-C, who was scrubbed for the entire procedure and necessary for satisfactory completion of surgery.  ANESTHESIA:  General anesthesia was used plus interscalene block.  ESTIMATED BLOOD LOSS:  Minimal.  FLUID REPLACEMENT:  1200 mL of crystalloid.  INSTRUMENT COUNTS:  Correct.  COMPLICATIONS:  There were no complications.  ANTIBIOTICS:  Perioperative antibiotics were given.  INDICATIONS:  The patient is a 48 year old male with worsening left shoulder pain.  Referral to advanced degenerative changes in the [facility] joint.  The patient has failed conservative management including injections, modification activity, anti-inflammatories, presents for operative treatment.  Risks and benefits of surgery were discussed and informed consent was obtained.  DESCRIPTION OF PROCEDURE:  After an adequate level of anesthesia achieved, the patient was positioned in the modified beach-chair position.  Left shoulder correctly identified and sterilely prepped and draped in usual manner.  Time-out was called.  The patient did have full passive range of motion with no undue stiffness, no instability.  After our time-out was called, we entered the  shoulder using standard portals including anterior, posterior and lateral portals.  Normal superior labral biceps complex was noted.  Biceps retracted in the joint.  No tears, no inflammation.  The subscapularis rolled edge was normal. Supraspinatus, infraspinatus, teres minor were visualized from the intra- articular surface were normal, articular cartilage normal.  No loose bodies identified.  We placed a scope in subacromial space.  Bursitis was identified as well as some spurring over the anterior and lateral acromion.  We went ahead and performed a gentle bursectomy and acromioplasty using a butcher block technique with a high-speed bur creating a type 1 acromial shape with release of the CA ligament.  We had a thorough decompression of the rotator cuff outlet.  We inspected the rotator cuff after the bursa was removed and noted there to be no tears.  We palpated the rotator cuff and it felt nice and thick, and there were no soft spots.  Following completion of our bursectomy acromioplasty, we concluded the arthroscopic portion of procedure.  We made a small saber incision overlying the AC joint.  Dissection down to subcutaneous tissues using needle-tip Bovie identified the deltotrapezial fascia, identified the fiber direction of that deltotrapezius fascia and divided in line with that distal clavicle. Subperiosteal dissection was performed followed by excision of distal 2- 3 mm of bone using an oscillating saw.  We had nice decompression of the [facility] joint.  I was able to place my finger in there and ranged the shoulder  fully, no impingement noted.  We removed also the thickened dorsal capsule of the AC joint.  The anterior and posterior ligaments were intact.  At this point, we placed a bone wax on the cut in the clavicle.  We thoroughly irrigated and removed excess bone wax.  We then repaired the deltotrapezius fascia with interrupted 0 Vicryl suture followed by 2-0 Vicryl  subcutaneous closure and 4-0 Monocryl for skin and portals.  Steri-Strips applied followed by sterile dressing.  The patient tolerated the surgery well.     Almedia Balls. Ranell Patrick, M.D.     SRN/MEDQ  D:  08/15/2014  T:  08/16/2014  Job:  [id number]

## 2014-08-19 ENCOUNTER — Encounter (HOSPITAL_COMMUNITY): Payer: Self-pay | Admitting: Orthopedic Surgery

## 2014-08-26 ENCOUNTER — Ambulatory Visit: Payer: Managed Care, Other (non HMO) | Attending: Orthopedic Surgery | Admitting: Physical Therapy

## 2014-12-01 DIAGNOSIS — I10 Essential (primary) hypertension: Secondary | ICD-10-CM | POA: Insufficient documentation

## 2015-06-23 DIAGNOSIS — E349 Endocrine disorder, unspecified: Secondary | ICD-10-CM | POA: Insufficient documentation

## 2015-12-29 ENCOUNTER — Encounter: Payer: Self-pay | Admitting: Allergy and Immunology

## 2015-12-29 ENCOUNTER — Ambulatory Visit (INDEPENDENT_AMBULATORY_CARE_PROVIDER_SITE_OTHER): Payer: Managed Care, Other (non HMO) | Admitting: Allergy and Immunology

## 2015-12-29 VITALS — BP 128/78 | HR 74 | Temp 98.6°F | Resp 16 | Ht 69.2 in | Wt 163.2 lb

## 2015-12-29 DIAGNOSIS — J45901 Unspecified asthma with (acute) exacerbation: Secondary | ICD-10-CM

## 2015-12-29 DIAGNOSIS — J4530 Mild persistent asthma, uncomplicated: Secondary | ICD-10-CM | POA: Insufficient documentation

## 2015-12-29 DIAGNOSIS — K219 Gastro-esophageal reflux disease without esophagitis: Secondary | ICD-10-CM | POA: Insufficient documentation

## 2015-12-29 DIAGNOSIS — J3089 Other allergic rhinitis: Secondary | ICD-10-CM | POA: Diagnosis not present

## 2015-12-29 MED ORDER — MOMETASONE FUROATE 50 MCG/ACT NA SUSP: NASAL | Status: DC

## 2015-12-29 MED ORDER — BENZONATATE 200 MG PO CAPS: 200.0000 mg | ORAL_CAPSULE | Freq: Three times a day (TID) | ORAL | Status: DC | PRN

## 2015-12-29 MED ORDER — BECLOMETHASONE DIPROPIONATE 80 MCG/ACT IN AERS: INHALATION_SPRAY | RESPIRATORY_TRACT | Status: DC

## 2015-12-29 MED ORDER — MONTELUKAST SODIUM 10 MG PO TABS: ORAL_TABLET | ORAL | Status: DC

## 2015-12-29 MED ORDER — ALBUTEROL SULFATE 108 (90 BASE) MCG/ACT IN AEPB: 2.0000 | INHALATION_SPRAY | RESPIRATORY_TRACT | Status: DC | PRN

## 2015-12-29 NOTE — Assessment & Plan Note (Addendum)
   Prednisone has been provided, 20 mg x 4 days, 10 mg x1 day, then stop.  A prescription has been provided for Qvar (beclomethasone) 80 g, 2 inhalations via spacer device twice a day during asthma flares or upper respiratory tract infections. For the next few days, he is to take 3 inhalations via spacer device 3 times a day.  A prescription has been provided for montelukast 10 mg daily at bedtime.  A prescription has been provided for ProAir Respiclick,1-2 inhalations every 4-6 hours as needed.  A prescription has been provided for benzonatate 200 mg 3 times a day when necessary.  The patient has been asked to contact me if his symptoms persist or progress. Otherwise, he may return for follow up in 2 months.

## 2015-12-29 NOTE — Assessment & Plan Note (Signed)
   Continue appropriate lifestyle modifications and esomeprazole 40 mg daily. 

## 2015-12-29 NOTE — Patient Instructions (Addendum)
Asthma with mild exacerbation  Prednisone has been provided, 20 mg x 4 days, 10 mg x1 day, then stop.  A prescription has been provided for Qvar (beclomethasone) 80 g, 2 inhalations via spacer device twice a day during asthma flares or upper respiratory tract infections. For the next few days, he is to take 3 inhalations via spacer device 3 times a day.  A prescription has been provided for montelukast 10 mg daily at bedtime.  A prescription has been provided for ProAir Respiclick,1-2 inhalations every 4-6 hours as needed.  A prescription has been provided for benzonatate 200 mg 3 times a day when necessary.  The patient has been asked to contact me if his symptoms persist or progress. Otherwise, he may return for follow up in 2 months.  Allergic rhinitis  A prescription has been provided for Nasonex nasal spray, one spray per nostril 1-2 times daily as needed. Proper nasal spray technique has been discussed and demonstrated.  Nasal saline lavage (NeilMed) as needed has been recommended along with instructions for proper administration.  Guaifenesin 1200 mg twice daily as needed with adequate hydration as discussed.   GERD (gastroesophageal reflux disease)  Continue appropriate lifestyle modifications and esomeprazole 40 mg daily.    Return in about 2 months (around 02/26/2016), or if symptoms worsen or fail to improve.

## 2015-12-29 NOTE — Progress Notes (Signed)
New Patient Note  RE: Ryan Baker MRN: [id number] DOB: [birth year] Date of Office Visit: 12/29/2015  Referring provider: Cheron Schaumann.,* Primary care provider: Doreatha Martin, MD  Chief Complaint: Cough and Nasal Congestion   History of present illness: HPI Comments: Ryan Baker is a 50 y.o. male who presents today for consultation of asthma exacerbation.  He has a history of asthma, allergic rhinitis, and gastroesophageal reflux. He reports that over the past 2 weeks he has experienced coughing, chest tightness, and wheezing.  The cough is described as nonproductive and he denies fevers or chills.  He experiences nasal congestion, postnasal drainage, and hoarseness.  He had been on prescribed asthma and allergy medications in the past, however he ran out of these medications one or 2 years ago.  He was most recently skin tested in June 2008 with positive results to grass pollen, weed pollen, ragweed pollen, tree pollen, mold, cat hair, dog epithelia, dust mite,  and cockroach antigen. he has no reflux related complaints today.     Assessment and plan: Asthma with mild exacerbation  Prednisone has been provided, 20 mg x 4 days, 10 mg x1 day, then stop.  A prescription has been provided for Qvar (beclomethasone) 80 g, 2 inhalations via spacer device twice a day during asthma flares or upper respiratory tract infections. For the next few days, he is to take 3 inhalations via spacer device 3 times a day.  A prescription has been provided for montelukast 10 mg daily at bedtime.  A prescription has been provided for ProAir Respiclick,1-2 inhalations every 4-6 hours as needed.  A prescription has been provided for benzonatate 200 mg 3 times a day when necessary.  The patient has been asked to contact me if his symptoms persist or progress. Otherwise, he may return for follow up in 2 months.  Allergic rhinitis  A prescription has been provided for Nasonex nasal  spray, one spray per nostril 1-2 times daily as needed. Proper nasal spray technique has been discussed and demonstrated.  Nasal saline lavage (NeilMed) as needed has been recommended along with instructions for proper administration.  Guaifenesin 1200 mg twice daily as needed with adequate hydration as discussed.   GERD (gastroesophageal reflux disease)  Continue appropriate lifestyle modifications and esomeprazole 40 mg daily.    Meds ordered this encounter  Medications  . beclomethasone (QVAR) 80 MCG/ACT inhaler    Sig: TWO PUFFS TWICE A DAY WITH SPACER TO PREVENT COUGH OR WHEEZE. RINSE, GARGLE AND SPIT AFTER USE.    Dispense:  1 Inhaler    Refill:  5  . montelukast (SINGULAIR) 10 MG tablet    Sig: ONE TABLET ONCE A DAY FOR COUGH OR WHEEZE.    Dispense:  30 tablet    Refill:  5  . Albuterol Sulfate (PROAIR RESPICLICK) 108 (90 Base) MCG/ACT AEPB    Sig: Inhale 2 puffs into the lungs every 4 (four) hours as needed (FOR COUGH OR WHEEZE.).    Dispense:  1 each    Refill:  2  . benzonatate (TESSALON) 200 MG capsule    Sig: Take 1 capsule (200 mg total) by mouth 3 (three) times daily as needed for cough.    Dispense:  20 capsule    Refill:  0  . mometasone (NASONEX) 50 MCG/ACT nasal spray    Sig: ONE SPRAY EACH NOSTRIL 1-2 TIMES  DAY AS NEEDED FOR NASAL CONGESTION OR DRAINAGE.    Dispense:  17 g    Refill:  5    Diagnositics: Spirometry: FVC is 5.09 L and FEV1 is 4.17 L.    Physical examination: Blood pressure 128/78, pulse 74, temperature 98.6 F (37 C), temperature source Oral, resp. rate 16, height 5' 9.2" (1.758 m), weight 163 lb 3.2 oz (74.027 kg).  General: Alert, interactive, in no acute distress. HEENT: TMs pearly gray, turbinates edematous without discharge, post-pharynx markedly erythematous. Neck: Supple without lymphadenopathy. Lungs: Clear to auscultation without wheezing, rhonchi or rales. CV: Normal S1, S2 without murmurs. Abdomen: Nondistended,  nontender. Skin: Warm and dry, without lesions or rashes. Extremities:  No clubbing, cyanosis or edema. Neuro:   Grossly intact.  Review of systems: Review of Systems  Constitutional: Negative for fever, chills and weight loss.  HENT: Positive for congestion. Negative for nosebleeds.   Eyes: Negative for blurred vision.  Respiratory: Positive for cough, sputum production, shortness of breath and wheezing. Negative for hemoptysis.   Cardiovascular: Negative for chest pain.  Gastrointestinal: Negative for diarrhea and constipation.  Genitourinary: Negative for dysuria.  Musculoskeletal: Negative for myalgias and joint pain.  Skin: Negative for itching and rash.  Neurological: Negative for dizziness.  Endo/Heme/Allergies: Does not bruise/bleed easily.    Past medical history: Past Medical History  Diagnosis Date  . Family history of anesthesia complication     Father vomits  . Prostatitis   . Coronary artery disease   . Depression   . Anxiety     generalized anxiety disorder  . GERD (gastroesophageal reflux disease)   . Eczema of scalp     Past surgical history: Past Surgical History  Procedure Laterality Date  . Wisdom tooth extraction    . Esophagogastroduodenoscopy    . Coronary angioplasty      sept 2013  . Shoulder arthroscopy Left 08/15/2014    Procedure: LEFT ARTHROSCOPY SHOULDER WITH ACROMIO CLAVICULAR RESECTION;  Surgeon: Verlee Rossetti, MD;  Location: [facility] OR;  Service: Orthopedics;  Laterality: Left;    Family history: Family History  Problem Relation Age of Onset  . Hypertension Father   . Hypertension Other   . Heart disease Other   . Allergic rhinitis Sister   . Angioedema Neg Hx   . Asthma Neg Hx   . Eczema Neg Hx   . Immunodeficiency Neg Hx   . Urticaria Neg Hx     Social history: Social History   Social History  . Marital Status: Married    Spouse Name: N/A  . Number of Children: N/A  . Years of Education: N/A   Occupational History  .  Not on file.   Social History Main Topics  . Smoking status: Never Smoker   . Smokeless tobacco: Never Used  . Alcohol Use: No     Comment: social- rare  . Drug Use: No  . Sexual Activity: Yes   Other Topics Concern  . Not on file   Social History Narrative   Environmental History: The patient lives in a 51 year old house with carpeting in the bedroom and central air/heat.  There are 2 dogs in house which have access to his bedroom.  He is nonsmoker.    Medication List       This list is accurate as of: 12/29/15  1:04 PM.  Always use your most recent med list.               Albuterol Sulfate 108 (90 Base) MCG/ACT Aepb  Commonly known as:  PROAIR RESPICLICK  Inhale 2 puffs into the lungs every 4 (four)  hours as needed (FOR COUGH OR WHEEZE.).     alfuzosin 10 MG 24 hr tablet  Commonly known as:  UROXATRAL  Take 10 mg by mouth daily as needed. For symptoms of prostatitis     alfuzosin 10 MG 24 hr tablet  Commonly known as:  UROXATRAL  Take 10 mg by mouth at bedtime.     amphetamine-dextroamphetamine 5 MG tablet  Commonly known as:  ADDERALL  3 (three) times daily.     aspirin EC 81 MG tablet  81 mg.     beclomethasone 80 MCG/ACT inhaler  Commonly known as:  QVAR  TWO PUFFS TWICE A DAY WITH SPACER TO PREVENT COUGH OR WHEEZE. RINSE, GARGLE AND SPIT AFTER USE.     benzonatate 200 MG capsule  Commonly known as:  TESSALON  Take 1 capsule (200 mg total) by mouth 3 (three) times daily as needed for cough.     clonazePAM 2 MG tablet  Commonly known as:  KLONOPIN  Take 2 mg by mouth at bedtime.     clonazePAM 1 MG tablet  Commonly known as:  KLONOPIN  2 tablets at bedtime as needed.     esomeprazole 40 MG capsule  Commonly known as:  NEXIUM  Take 40 mg by mouth daily as needed.     fexofenadine 60 MG tablet  Commonly known as:  ALLEGRA  Take 60 mg by mouth 2 (two) times daily.     lamoTRIgine 25 MG tablet  Commonly known as:  LAMICTAL  125 mg at bedtime.      metaxalone 400 MG tablet  Commonly known as:  SKELAXIN  Take by mouth.     methocarbamol 500 MG tablet  Commonly known as:  ROBAXIN  Take 1 tablet (500 mg total) by mouth 3 (three) times daily as needed.     mometasone 50 MCG/ACT nasal spray  Commonly known as:  NASONEX  ONE SPRAY EACH NOSTRIL 1-2 TIMES  DAY AS NEEDED FOR NASAL CONGESTION OR DRAINAGE.     montelukast 10 MG tablet  Commonly known as:  SINGULAIR  Take 10 mg by mouth.     montelukast 10 MG tablet  Commonly known as:  SINGULAIR  ONE TABLET ONCE A DAY FOR COUGH OR WHEEZE.     MULTI-VITAMINS Tabs  Frequency:daily   Dosage:0.0     Instructions:Multivitamin ( TABS, 1 Oral daily)  Note:     multivitamin with minerals Tabs tablet  Take 1 tablet by mouth daily.     PATADAY 0.2 % Soln  Generic drug:  Olopatadine HCl  as needed.     rosuvastatin 10 MG tablet  Commonly known as:  CRESTOR  Take by mouth.     Testosterone 30 MG/ACT Soln  Apply 20 mg topically.     Vitamin B-12 CR 1000 MCG Tbcr  Take as directed.     vitamin C 500 MG tablet  Commonly known as:  ASCORBIC ACID  500 mg.     Vitamin D 2000 units tablet  Take by mouth.        Known medication allergies: Allergies  Allergen Reactions  . Amoxicillin Diarrhea  . Flomax [Tamsulosin Hcl] Palpitations  . Tamsulosin Palpitations    I appreciate the opportunity to take part in this Shonte's care. Please do not hesitate to contact me with questions.  Sincerely,   R. Jorene Guest, MD

## 2015-12-29 NOTE — Assessment & Plan Note (Signed)
   A prescription has been provided for Nasonex nasal spray, one spray per nostril 1-2 times daily as needed. Proper nasal spray technique has been discussed and demonstrated.  Nasal saline lavage (NeilMed) as needed has been recommended along with instructions for proper administration.  Guaifenesin 1200 mg twice daily as needed with adequate hydration as discussed.

## 2015-12-31 ENCOUNTER — Encounter: Payer: Self-pay | Admitting: *Deleted

## 2016-01-15 ENCOUNTER — Telehealth: Payer: Self-pay

## 2016-01-15 ENCOUNTER — Other Ambulatory Visit: Payer: Self-pay

## 2016-01-15 DIAGNOSIS — J45901 Unspecified asthma with (acute) exacerbation: Secondary | ICD-10-CM

## 2016-01-15 NOTE — Telephone Encounter (Signed)
Was seen 2 wks ago and still having the same sxs ( mild wheezing and coughing, stuffy head and ears, a lot of drainage, and sneezing).  Was giving a Z-Pak by primary a week ago and also took the baby pak you gave him at OV  Mild. He wants to know if there is anything else he can do.

## 2016-01-15 NOTE — Telephone Encounter (Signed)
Pt has been sick since jan 27th. He has been taking tussinex pearls, mucinex, singulair, also had a baby pack of prdnisone and qvar 80 2 puffs bid with spacer, pt uses allegra 12 hr everyday, and nasal rinse at night. He got so bad last night that he used benadryl. Pt is still sick and is not getting any better. What else can we do to help him?  His discharge has turned very watery and is coughing nonstop. Sinuses are burning. Pts wife says he has dark circles around his eyes and nose. Pt does have some mild ear pain. Pt has not ran a fever. Pt has coughed up clear junk.

## 2016-01-15 NOTE — Telephone Encounter (Signed)
Pt has been sick since jan 27th. He has been taking tussinex pearls, mucinex, and singulair, and also had a baby pack of prdnisone and qvar 80 2 puffs bid with spacer. Pt is still sick and is not getting any better. What else can we do to help him?  His discharge has turned

## 2016-01-18 NOTE — Telephone Encounter (Signed)
Please inform the patient that I was not in the office when he called.  Please ask him about his symptoms over the weekend and today, i.e., any fevers or chills, any discolored mucus production, any changes or other symptoms?  Therapy will be directed based upon this information.  Thanks.

## 2016-01-18 NOTE — Telephone Encounter (Signed)
Called patient to request further information for Dr. Nunzio Cobbs.  Voice message left on patient's confidential voice mail to call our office back

## 2016-01-18 NOTE — Telephone Encounter (Signed)
No fever or chills Productive mucous  Yellow in AM, clear during day Z pack 2 weeks ago Crinkly feeling in ears . Lots of pain in sinus areas and up in cheek bones, nose in forehead, pain and burning Q var not helping Singulair at night Nasonex, taking Cough, out of tessalon pearls Completed prednisone after last office visit Is willing to take prednisone injection to boost him and calm it down Using Allegra and sudafed during day Taking robitussin caps Using nelli rinse at night with saline Is worn out after a month

## 2016-01-18 NOTE — Telephone Encounter (Signed)
Is he able to be seen tomorrow. If so, I will work him into my schedule. It sounds like we may need to readjust his asthma regimen in addition to acute treatment and it would be good to have spirometry to help guide therapy. Thanks.

## 2016-01-19 ENCOUNTER — Encounter: Payer: Self-pay | Admitting: Allergy and Immunology

## 2016-01-19 ENCOUNTER — Ambulatory Visit (INDEPENDENT_AMBULATORY_CARE_PROVIDER_SITE_OTHER): Payer: Managed Care, Other (non HMO) | Admitting: Allergy and Immunology

## 2016-01-19 VITALS — BP 120/75 | HR 76 | Temp 98.2°F | Resp 16

## 2016-01-19 DIAGNOSIS — J0100 Acute maxillary sinusitis, unspecified: Secondary | ICD-10-CM | POA: Diagnosis not present

## 2016-01-19 DIAGNOSIS — J45901 Unspecified asthma with (acute) exacerbation: Secondary | ICD-10-CM | POA: Diagnosis not present

## 2016-01-19 DIAGNOSIS — J3089 Other allergic rhinitis: Secondary | ICD-10-CM | POA: Diagnosis not present

## 2016-01-19 DIAGNOSIS — J019 Acute sinusitis, unspecified: Secondary | ICD-10-CM | POA: Insufficient documentation

## 2016-01-19 MED ORDER — METHYLPREDNISOLONE ACETATE 80 MG/ML IJ SUSP
80.0000 mg | Freq: Once | INTRAMUSCULAR | Status: AC
  Administered 2016-01-19: 80 mg via INTRAMUSCULAR

## 2016-01-19 MED ORDER — PREDNISONE 1 MG PO TABS: 10.0000 mg | ORAL_TABLET | ORAL | Status: AC

## 2016-01-19 NOTE — Assessment & Plan Note (Signed)
   Continue appropriate allergen avoidance measures, montelukast daily, and Nasonex as needed.

## 2016-01-19 NOTE — Progress Notes (Signed)
Follow-up Note  RE: Ryan Baker MRN: [id number] DOB: 01-07-1966 Date of Office Visit: 01/19/2016  Primary care provider: Doreatha Martin, MD Referring provider: Cheron Schaumann.,*  History of present illness: HPI Comments: Ryan Baker is a 50 y.o. male persistent asthma, allergic rhinitis, and gastroesophageal reflux disease who presents today for sick visit.  He complains of sinus pressure/pain, particularly over the cheek bones and between the eyes.  He also complains of nasal congestion and thick postnasal drainage.  Approximately 2 weeks ago he was prescribed erythromycin without perceived benefit.  He also reports that he is experiencing coughing as well as wheezing, particularly at nighttime.  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  He takes fexofenadine twice daily and added pseudoephedrine over the past few days.   Assessment and plan: Acute sinusitis  Depo-Medrol 80 mg was administered in the office.  Prednisone has been provided and is to be started tomorrow as follows: 20 mg daily x 4 days, 10 mg x1 day, then stop.  Nasal saline lavage (NeilMed) as needed has been recommended along with instructions for proper administration.  Guaifenesin 1200 mg (plus/minus pseudoephedrine 120 mg) twice daily as needed with adequate hydration. Pseudoephedrine is only to be used for short-term relief of nasal/sinus congestion. Long-term use is discouraged due to potential side effects.   Continue Nasonex, 2 sprays per nostril daily as needed.  Hold fexofenadine for now as second generation antihistamines may contribute to mucous viscosity.  [name] has been asked to contact me if his symptoms persist, progress, or if he becomes febrile. Otherwise, he may return for follow up in 4 months.  Asthma with acute exacerbation  Depo-Medrol and prednisone have been provided (as above).  Continue Qvar 80 g, 2 inhalations via spacer device  twice a day, montelukast 10 mg daily at bedtime, and albuterol every 4-6 hours as needed.  Subjective and objective measures of pulmonary function will be followed and the treatment plan will be adjusted accordingly.  Allergic rhinitis  Continue appropriate allergen avoidance measures, montelukast daily, and Nasonex as needed.    Meds ordered this encounter  Medications  . methylPREDNISolone acetate (DEPO-MEDROL) injection 80 mg    Sig:   . predniSONE (DELTASONE) tablet 10 mg    Sig:     Diagnositics: Spirometry reveals an FVC of 4.25 L and an FEV1 of 3.56 L.  Please see scanned spirometry results for details.    Physical examination: Blood pressure 120/75, pulse 76, temperature 98.2 F (36.8 C), temperature source Oral, resp. rate 16.  General: Alert, interactive, in no acute distress. HEENT: TMs pearly gray, turbinates edematous with thick discharge, post-pharynx erythematous. Neck: Supple without lymphadenopathy. Lungs: Clear to auscultation without wheezing, rhonchi or rales. CV: Normal S1, S2 without murmurs. Skin: Warm and dry, without lesions or rashes.  The following portions of the patient's history were reviewed and updated as appropriate: allergies, current medications, past family history, past medical history, past social history, past surgical history and problem list.    Medication List       This list is accurate as of: 01/19/16  6:18 PM.  Always use your most recent med list.               Albuterol Sulfate 108 (90 Base) MCG/ACT Aepb  Commonly known as:  PROAIR RESPICLICK  Inhale 2 puffs into the lungs every 4 (four) hours as needed (FOR COUGH OR WHEEZE.).     alfuzosin 10  MG 24 hr tablet  Commonly known as:  UROXATRAL  Take 10 mg by mouth daily as needed. For symptoms of prostatitis     alfuzosin 10 MG 24 hr tablet  Commonly known as:  UROXATRAL  Take 10 mg by mouth at bedtime. Reported on 01/19/2016     amphetamine-dextroamphetamine 5 MG  tablet  Commonly known as:  ADDERALL  3 (three) times daily.     aspirin EC 81 MG tablet  81 mg.     beclomethasone 80 MCG/ACT inhaler  Commonly known as:  QVAR  TWO PUFFS TWICE A DAY WITH SPACER TO PREVENT COUGH OR WHEEZE. RINSE, GARGLE AND SPIT AFTER USE.     benzonatate 200 MG capsule  Commonly known as:  TESSALON  Take 1 capsule (200 mg total) by mouth 3 (three) times daily as needed for cough.     clonazePAM 2 MG tablet  Commonly known as:  KLONOPIN  Take 2 mg by mouth at bedtime. Reported on 01/19/2016     clonazePAM 1 MG tablet  Commonly known as:  KLONOPIN  2 tablets at bedtime as needed. Reported on 01/19/2016     esomeprazole 40 MG capsule  Commonly known as:  NEXIUM  Take 40 mg by mouth daily as needed.     fexofenadine 60 MG tablet  Commonly known as:  ALLEGRA  Take 60 mg by mouth 2 (two) times daily.     lamoTRIgine 25 MG tablet  Commonly known as:  LAMICTAL  125 mg at bedtime.     metaxalone 400 MG tablet  Commonly known as:  SKELAXIN  Take by mouth.     methocarbamol 500 MG tablet  Commonly known as:  ROBAXIN  Take 1 tablet (500 mg total) by mouth 3 (three) times daily as needed.     mometasone 50 MCG/ACT nasal spray  Commonly known as:  NASONEX  ONE SPRAY EACH NOSTRIL 1-2 TIMES  DAY AS NEEDED FOR NASAL CONGESTION OR DRAINAGE.     montelukast 10 MG tablet  Commonly known as:  SINGULAIR  Take 10 mg by mouth.     montelukast 10 MG tablet  Commonly known as:  SINGULAIR  ONE TABLET ONCE A DAY FOR COUGH OR WHEEZE.     MULTI-VITAMINS Tabs  Frequency:daily   Dosage:0.0     Instructions:Multivitamin ( TABS, 1 Oral daily)  Note:     multivitamin with minerals Tabs tablet  Take 1 tablet by mouth daily. Reported on 01/19/2016     PATADAY 0.2 % Soln  Generic drug:  Olopatadine HCl  as needed.     rosuvastatin 10 MG tablet  Commonly known as:  CRESTOR  Take by mouth.     Testosterone 30 MG/ACT Soln  Apply 20 mg topically.     Vitamin B-12 CR 1000  MCG Tbcr  Take as directed.     vitamin C 500 MG tablet  Commonly known as:  ASCORBIC ACID  500 mg.     Vitamin D 2000 units tablet  Take by mouth.        Allergies  Allergen Reactions  . Amoxicillin Diarrhea  . Flomax [Tamsulosin Hcl] Palpitations  . Tamsulosin Palpitations   Review of systems: Constitutional: Negative for fever, chills and weight loss.  HENT: Negative for nosebleeds.   Positive for nasal congestion, postnasal drainage, sinus pressure. Eyes: Negative for blurred vision.  Respiratory: Negative for hemoptysis.   Positive for coughing, wheezing. Cardiovascular: Negative for chest pain.  Gastrointestinal: Negative for diarrhea and  constipation.  Genitourinary: Negative for dysuria.  Musculoskeletal: Negative for myalgias and joint pain.  Neurological: Negative for dizziness.  Endo/Heme/Allergies: Does not bruise/bleed easily.   Past Medical History  Diagnosis Date  . Family history of anesthesia complication     Father vomits  . Prostatitis   . Coronary artery disease   . Depression   . Anxiety     generalized anxiety disorder  . GERD (gastroesophageal reflux disease)   . Eczema of scalp     Family History  Problem Relation Age of Onset  . Hypertension Father   . Hypertension Other   . Heart disease Other   . Allergic rhinitis Sister   . Angioedema Neg Hx   . Asthma Neg Hx   . Eczema Neg Hx   . Immunodeficiency Neg Hx   . Urticaria Neg Hx     Social History   Social History  . Marital Status: Married    Spouse Name: N/A  . Number of Children: N/A  . Years of Education: N/A   Occupational History  . Not on file.   Social History Main Topics  . Smoking status: Never Smoker   . Smokeless tobacco: Never Used  . Alcohol Use: No     Comment: social- rare  . Drug Use: No  . Sexual Activity: Yes   Other Topics Concern  . Not on file   Social History Narrative    I appreciate the opportunity to take part in this Jarrius's care.  Please do not hesitate to contact me with questions.  Sincerely,   R. Jorene Guest, MD

## 2016-01-19 NOTE — Patient Instructions (Addendum)
Acute sinusitis  Depo-Medrol 80 mg was administered in the office.  Prednisone has been provided and is to be started tomorrow as follows: 20 mg daily x 4 days, 10 mg x1 day, then stop.  Nasal saline lavage (NeilMed) as needed has been recommended along with instructions for proper administration.  Guaifenesin 1200 mg (plus/minus pseudoephedrine 120 mg) twice daily as needed with adequate hydration. Pseudoephedrine is only to be used for short-term relief of nasal/sinus congestion. Long-term use is discouraged due to potential side effects.   Continue Nasonex, 2 sprays per nostril daily as needed.  Hold fexofenadine for now as second generation antihistamines may contribute to mucous viscosity.  Ryan Baker has been asked to contact me if his symptoms persist, progress, or if he becomes febrile. Otherwise, he may return for follow up in 4 months.  Asthma with acute exacerbation  Depo-Medrol and prednisone have been provided (as above).  Continue Qvar 80 g, 2 inhalations via spacer device twice a day, montelukast 10 mg daily at bedtime, and albuterol every 4-6 hours as needed.  Subjective and objective measures of pulmonary function will be followed and the treatment plan will be adjusted accordingly.  Allergic rhinitis  Continue appropriate allergen avoidance measures, montelukast daily, and Nasonex as needed.    Return in about 4 months (around 05/18/2016), or if symptoms worsen or fail to improve.

## 2016-01-19 NOTE — Assessment & Plan Note (Addendum)
   Depo-Medrol 80 mg was administered in the office.  Prednisone has been provided and is to be started tomorrow as follows: 20 mg daily x 4 days, 10 mg x1 day, then stop.  Nasal saline lavage (NeilMed) as needed has been recommended along with instructions for proper administration.  Guaifenesin 1200 mg (plus/minus pseudoephedrine 120 mg) twice daily as needed with adequate hydration. Pseudoephedrine is only to be used for short-term relief of nasal/sinus congestion. Long-term use is discouraged due to potential side effects.   Continue Nasonex, 2 sprays per nostril daily as needed.  Hold fexofenadine for now as second generation antihistamines may contribute to mucous viscosity.  Ryan Baker has been asked to contact me if his symptoms persist, progress, or if he becomes febrile. Otherwise, he may return for follow up in 4 months.

## 2016-01-19 NOTE — Assessment & Plan Note (Signed)
   Depo-Medrol and prednisone have been provided (as above).  Continue Qvar 80 g, 2 inhalations via spacer device twice a day, montelukast 10 mg daily at bedtime, and albuterol every 4-6 hours as needed.  Subjective and objective measures of pulmonary function will be followed and the treatment plan will be adjusted accordingly.

## 2016-05-17 ENCOUNTER — Encounter (HOSPITAL_BASED_OUTPATIENT_CLINIC_OR_DEPARTMENT_OTHER): Payer: Self-pay | Admitting: Emergency Medicine

## 2016-11-22 ENCOUNTER — Other Ambulatory Visit: Payer: Self-pay | Admitting: Allergy

## 2016-11-22 MED ORDER — MOMETASONE FUROATE 50 MCG/ACT NA SUSP: NASAL | 3 refills | Status: DC

## 2017-03-20 ENCOUNTER — Other Ambulatory Visit: Payer: Self-pay | Admitting: Allergy

## 2017-03-20 MED ORDER — MOMETASONE FUROATE 50 MCG/ACT NA SUSP: NASAL | 1 refills | Status: DC

## 2017-05-19 ENCOUNTER — Other Ambulatory Visit: Payer: Self-pay

## 2017-05-19 MED ORDER — MOMETASONE FUROATE 50 MCG/ACT NA SUSP: NASAL | 0 refills | Status: AC

## 2017-06-19 ENCOUNTER — Other Ambulatory Visit: Payer: Self-pay | Admitting: Allergy

## 2017-07-17 ENCOUNTER — Other Ambulatory Visit: Payer: Self-pay | Admitting: Allergy

## 2017-08-15 ENCOUNTER — Other Ambulatory Visit: Payer: Self-pay

## 2017-09-18 ENCOUNTER — Other Ambulatory Visit: Payer: Self-pay | Admitting: *Deleted

## 2017-10-16 ENCOUNTER — Other Ambulatory Visit: Payer: Self-pay

## 2017-10-16 NOTE — Telephone Encounter (Signed)
Declined nasonex nasal spray. Last refill pt. Was told no more refills until he is seen.

## 2017-11-17 ENCOUNTER — Other Ambulatory Visit: Payer: Self-pay

## 2017-11-17 NOTE — Telephone Encounter (Signed)
RF for Nasonex denied, pt needs an OV

## 2020-03-30 ENCOUNTER — Ambulatory Visit (INDEPENDENT_AMBULATORY_CARE_PROVIDER_SITE_OTHER): Payer: 59

## 2020-03-30 ENCOUNTER — Encounter: Payer: Self-pay | Admitting: Family Medicine

## 2020-03-30 ENCOUNTER — Ambulatory Visit (INDEPENDENT_AMBULATORY_CARE_PROVIDER_SITE_OTHER): Payer: 59 | Admitting: Family Medicine

## 2020-03-30 ENCOUNTER — Ambulatory Visit: Payer: Managed Care, Other (non HMO) | Admitting: Family Medicine

## 2020-03-30 ENCOUNTER — Other Ambulatory Visit: Payer: Self-pay

## 2020-03-30 DIAGNOSIS — M545 Low back pain, unspecified: Secondary | ICD-10-CM

## 2020-03-30 NOTE — Progress Notes (Signed)
Right sided low back pain  Currently no leg pain Injured back Monday after easter  Been working with PT ever since Started a prednisone after injury Taking advil as needed Using ice pack

## 2020-03-30 NOTE — Progress Notes (Signed)
Ryan Baker - 54 y.o. male MRN [id number]  Date of birth: [birth year]  Office Visit Note: Visit Date: 03/30/2020 PCP: Cheron Schaumann., MD Referred by: Cheron Schaumann.,*  Subjective: Chief Complaint  Patient presents with  . Lower Back - Pain   HPI: Ryan Baker is a 54 y.o. male who comes in today with right sided lower back pain for the past month. He reports that he has had chronic right sided SI joint for the past 5 years (flares up 4-5 x a year), has been seeing [place] this. About a month ago, he was doing burpees and felt a sharp pain in his lower spine. He went to his PT who felt like his pain was more L4-L5 than SI joint pain. His PCP prescribed prednisone dose pack that helped while he was taking it but pain returned after he stopped. He started to have some pain down the front of his leg into his foot about 2 weeks ago. This has since stopped as he has been taking it easy, focusing on core strengthening and has been doing some rehab exercises in a pool. 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   ROS Otherwise per HPI.  Assessment & Plan: Visit Diagnoses:  1. Right low back pain, unspecified chronicity, unspecified whether sciatica present     Plan: Suspect that low back pain/SI joint pain is from L4-L5 radiculopathy from a disc protrusion. He has L4-L5 grade 1 spondylolisthesis on my review of lumbar x-ray. Will obtain MRI to further tailor treatment.   Meds & Orders: No orders of the defined types were placed in this encounter.   Orders Placed This Encounter  Procedures  . XR Lumbar Spine 2-3 Views    Follow-up: No follow-ups on file.   Procedures: No procedures performed  No notes on file   Clinical History: No specialty comments available.   He reports that he has never smoked. He has never used smokeless tobacco. No results for input(s): HGBA1C, LABURIC in the last 8760  hours.  Objective:  VS:  HT:    WT:   BMI:     BP:   HR: bpm  TEMP: ( )  RESP:  Physical Exam  PHYSICAL EXAM: Gen: NAD, alert, cooperative with exam, well-appearing HEENT: clear conjunctiva,  CV:  no edema, capillary refill brisk, normal rate Resp: non-labored Skin: no rashes, normal turgor  Neuro: no gross deficits.  Psych:  alert and oriented  Ortho Exam  Lumbar spine: - Inspection: no gross deformity or asymmetry, swelling or ecchymosis - Palpation: TTP over L4-L5 spinous processes as well as right SI joint - ROM: full active ROM of the lumbar spine in flexion- pain with extension  - Strength: 5/5 strength of lower extremity in L4-S1 nerve root distributions b/l; normal gait - Neuro: sensation intact in the L4-S1 nerve root distribution b/l, 2+ L4 and S1 reflexes - Special testing: pain with straight leg raise but no radicular symptoms. Pain with FABER.   Imaging: L4-L5 grade 1 spondylolisthesis  Past Medical/Family/Surgical/Social History: Medications & Allergies reviewed per EMR, new medications updated. Patient Active Problem List   Diagnosis Date Noted  . Acute sinusitis 01/19/2016  . Mild persistent asthma 12/29/2015  . GERD (gastroesophageal reflux disease) 12/29/2015  . Hypotestosteronism 06/23/2015  . Essential (primary) hypertension 12/01/2014  . Allergic rhinitis 06/09/2014  . Anxiety and depression 06/09/2014  . Arteriosclerosis of coronary  artery 06/09/2014  . ADD (attention deficit disorder) 06/09/2014  . Benign fibroma of prostate 06/09/2014  . Carotid artery tenderness 06/09/2014  . CAD in native artery 06/09/2014  . CN (constipation) 06/09/2014  . Acid reflux 06/09/2014  . Fatigue 06/09/2014  . Anxiety, generalized 06/09/2014  . HLD (hyperlipidemia) 06/09/2014  . Fungal infection of nail 06/09/2014  . Rotator cuff syndrome 06/09/2014  . Breath shortness 06/09/2014  . Testicular hypofunction 06/09/2014  . FOM (frequency of micturition)  06/09/2014  . Cannot sleep 05/14/2014  . Asthma with acute exacerbation 03/26/2014  . Disorder of shoulder 11/13/2013   Past Medical History:  Diagnosis Date  . Anxiety    generalized anxiety disorder  . Coronary artery disease   . Depression   . Eczema of scalp   . Enlarged prostate   . Family history of anesthesia complication    Father vomits  . GERD (gastroesophageal reflux disease)   . Hyperlipemia   . Prostatitis    Family History  Problem Relation Age of Onset  . Hypertension Father   . Hypertension Other   . Heart disease Other   . Allergic rhinitis Sister   . Angioedema Neg Hx   . Asthma Neg Hx   . Eczema Neg Hx   . Immunodeficiency Neg Hx   . Urticaria Neg Hx    Past Surgical History:  Procedure Laterality Date  . CORONARY ANGIOPLASTY     sept 2013  . CORONARY ANGIOPLASTY WITH STENT PLACEMENT    . ESOPHAGOGASTRODUODENOSCOPY    . SHOULDER ARTHROSCOPY Left 08/15/2014   Procedure: LEFT ARTHROSCOPY SHOULDER WITH ACROMIO CLAVICULAR RESECTION;  Surgeon: Augustin Schooling, MD;  Location: [facility];  Service: Orthopedics;  Laterality: Left;  Marland Kitchen VASECTOMY    . WISDOM TOOTH EXTRACTION     Social History   Occupational History  . Not on file  Tobacco Use  . Smoking status: Never Smoker  . Smokeless tobacco: Never Used  Substance and Sexual Activity  . Alcohol use: No    Comment: social- rare  . Drug use: No  . Sexual activity: Yes

## 2020-03-30 NOTE — Progress Notes (Signed)
I saw and examined the patient with Dr. Robby Sermon and agree with assessment and plan as outlined.    Chronic right-sided SI pain for several years, flares up about 3-4 times per year.  After Easter, things changed.  Pain during burpees.  Then when working with PT (Dr. Laddie Aquas), had severe pain down right leg when working on the L4-5 area.  Prednisone helped, but still in pain.    Neuro exam normal.  Tender at right SI and in midline over L4-5.  X-rays show very slight retrolisthesis of L4 on L5.    Will order lumbar MRI to rule out disc protrusion.  If negative, will keep working on SI joint.  Could contemplate dextrose prolotherapy in the future if needed.

## 2020-05-21 ENCOUNTER — Ambulatory Visit (INDEPENDENT_AMBULATORY_CARE_PROVIDER_SITE_OTHER): Payer: 59 | Admitting: Allergy and Immunology

## 2020-05-21 ENCOUNTER — Other Ambulatory Visit: Payer: Self-pay

## 2020-05-21 ENCOUNTER — Encounter: Payer: Self-pay | Admitting: Allergy and Immunology

## 2020-05-21 VITALS — BP 152/100 | HR 68 | Temp 97.8°F | Resp 16 | Ht 68.74 in | Wt 153.2 lb

## 2020-05-21 DIAGNOSIS — R030 Elevated blood-pressure reading, without diagnosis of hypertension: Secondary | ICD-10-CM | POA: Diagnosis not present

## 2020-05-21 DIAGNOSIS — J3089 Other allergic rhinitis: Secondary | ICD-10-CM

## 2020-05-21 DIAGNOSIS — J45901 Unspecified asthma with (acute) exacerbation: Secondary | ICD-10-CM | POA: Diagnosis not present

## 2020-05-21 DIAGNOSIS — K219 Gastro-esophageal reflux disease without esophagitis: Secondary | ICD-10-CM

## 2020-05-21 MED ORDER — CARBINOXAMINE MALEATE 4 MG PO TABS: ORAL_TABLET | ORAL | 5 refills | Status: DC

## 2020-05-21 MED ORDER — BREZTRI AEROSPHERE 160-9-4.8 MCG/ACT IN AERO: 2.0000 | INHALATION_SPRAY | Freq: Two times a day (BID) | RESPIRATORY_TRACT | 5 refills | Status: DC

## 2020-05-21 MED ORDER — XHANCE 93 MCG/ACT NA EXHU: 2.0000 "application " | INHALANT_SUSPENSION | Freq: Two times a day (BID) | NASAL | 5 refills | Status: AC

## 2020-05-21 MED ORDER — ESOMEPRAZOLE MAGNESIUM 40 MG PO CPDR: 40.0000 mg | DELAYED_RELEASE_CAPSULE | Freq: Every day | ORAL | 5 refills | Status: DC

## 2020-05-21 MED ORDER — METHYLPREDNISOLONE ACETATE 80 MG/ML IJ SUSP
80.0000 mg | Freq: Once | INTRAMUSCULAR | Status: AC
  Administered 2020-05-21: 80 mg via INTRAMUSCULAR

## 2020-05-21 NOTE — Progress Notes (Signed)
New Patient Note  RE: Ryan Baker MRN: [id number] DOB: [birth year] Date of Office Visit: 05/21/2020  Referring provider: Cheron Schaumann.,* Primary care provider: Cheron Schaumann., MD  Chief Complaint: Asthma and Allergic Rhinitis   History of present illness: Ryan Baker is a 54 y.o. male seen today in consultation requested by Doreatha Martin, MD.  He reports that approximately 5 weeks ago he had been doing some repair work under the house and in his shower and was exposed to mold.  He began to experience upper and lower respiratory symptoms that initially thought may have just been a cold.  He went to the urgent care and tested negative for Covid.  He reports that he has been "having trouble taking full breaths" and experiences a burning sensation in his lungs with deep inhalation and with coughing.  He has had a persistent cough which is described as minimally productive and originates in the upper chest/lower throat.  In addition to the coughing and difficulty taking full breaths, he has had occasional wheezing.  He was prescribed albuterol HFA at the urgent care, however he felt that this medication exacerbated his cough.  He was also prescribed a 7-day course of prednisone and a Z-Pak without perceived benefit. He has been experiencing nasal congestion, rhinorrhea, and thick postnasal drainage despite taking Nasonex.  He reports that the coughing and the nasal symptoms seem to get worse when he goes from the heat outdoors into an air conditioned building.  In addition, when he goes into his home he feels that his nose "closes up."  He denies fevers but believes that he has been chilled on a few occasions. Ryan Baker also expresses concern because over the past few weeks his blood pressure has been somewhat elevated despite the fact that he has never had blood pressure issues in the past.  Assessment and plan: Asthma with acute exacerbation  Depo-Medrol 80 mg was administered in  the office.  Prednisone has been provided and is to be started tomorrow as follows: 20 mg daily x 4 days, 10 mg x1 day, then stop.  A prescription has been provided for BrezTri 160 mcg, 2 inhalations twice daily.  To maximize pulmonary deposition, a spacer has been provided along with instructions for its proper administration with an HFA inhaler.  Albuterol via spacer device every 4-6 hours if needed.  The patient has been asked to contact me if his symptoms persist or progress. Otherwise, he may return for follow up in 2 months.  Allergic rhinitis  Aeroallergen avoidance measures have been discussed and provided in written form.  A prescription has been provided for [facility], 2 actuations per nostril twice a day. Proper technique has been discussed and demonstrated.  Nasal saline lavage (NeilMed) has been recommended as needed and prior to medicated nasal sprays along with instructions for proper administration.  A prescription has been provided for carbinoxamine 4 mg every 6-8 hours if needed.  For thick post nasal drainage, add guaifenesin 1200 mg (Mucinex Maximum Strength)  twice daily as needed with adequate hydration as discussed.  If allergen avoidance measures and medications fail to adequately relieve symptoms, aeroallergen immunotherapy will be considered.  GERD (gastroesophageal reflux disease)  Continue appropriate lifestyle modifications.  A refill prescription has been provided for esomeprazole 40 mg daily.  Elevated blood-pressure reading without diagnosis of hypertension  The patient has been made aware of the elevated blood pressure reading and has been encouraged to follow up with his primary care physician  in the near future regarding this issue.  [name] has verbalized understanding and agreed to do so.   Meds ordered this encounter  Medications  . Budeson-Glycopyrrol-Formoterol (BREZTRI AEROSPHERE) 160-9-4.8 MCG/ACT AERO    Sig: Inhale 2 puffs into the lungs in  the morning and at bedtime. Use with spacer. Rinse, gargle and spit out after use.    Dispense:  10.7 g    Refill:  5  . Fluticasone Propionate (XHANCE) 93 MCG/ACT EXHU    Sig: Place 2 application into the nose in the morning and at bedtime.    Dispense:  16 mL    Refill:  5  . Carbinoxamine Maleate 4 MG TABS    Sig: Take 4 mg every 6-8 hours as needed    Dispense:  28 tablet    Refill:  5  . esomeprazole (NEXIUM) 40 MG capsule    Sig: Take 1 capsule (40 mg total) by mouth daily.    Dispense:  30 capsule    Refill:  5  . methylPREDNISolone acetate (DEPO-MEDROL) injection 80 mg    Diagnostics: Spirometry: Spirometry reveals an FVC of 4.21 L and an FEV1 of 3.25 L (90% predicted) with significant (610 mL, 22%) postbronchodilator improvement.  Please see scanned spirometry results for details. Epicutaneous testing: Robust reactivity to grass pollen and ragweed pollen Intradermal testing: Positive to weed mix, tree mix, molds, cat hair, dog epithelia, cockroach antigen, and dust mite antigen.   Physical examination: Blood pressure (!) 152/100, pulse 68, temperature 97.8 F (36.6 C), temperature source Oral, resp. rate 16, height 5' 8.74" (1.746 m), weight 153 lb 3.5 oz (69.5 kg), SpO2 100 %.  General: Alert, interactive, in no acute distress. HEENT: TMs pearly gray, turbinates moderately edematous without discharge, post-pharynx moderately erythematous. Neck: Supple without lymphadenopathy. Lungs: Clear to auscultation without wheezing, rhonchi or rales. CV: Normal S1, S2 without murmurs. Abdomen: Nondistended, nontender. Skin: Warm and dry, without lesions or rashes. Extremities:  No clubbing, cyanosis or edema. Neuro:   Grossly intact.  Review of systems:  Review of systems negative except as noted in HPI / PMHx or noted below: Review of Systems  Constitutional: Negative.   HENT: Negative.   Eyes: Negative.   Respiratory: Negative.   Cardiovascular: Negative.     Gastrointestinal: Negative.   Genitourinary: Negative.   Musculoskeletal: Negative.   Skin: Negative.   Neurological: Negative.   Endo/Heme/Allergies: Negative.   Psychiatric/Behavioral: Negative.     Past medical history:  Past Medical History:  Diagnosis Date  . Anxiety    generalized anxiety disorder  . Asthma   . Coronary artery disease   . Depression   . Eczema of scalp   . Enlarged prostate   . Family history of anesthesia complication    Father vomits  . GERD (gastroesophageal reflux disease)   . Hyperlipemia   . Prostatitis     Past surgical history:  Past Surgical History:  Procedure Laterality Date  . CORONARY ANGIOPLASTY     sept 2013  . CORONARY ANGIOPLASTY WITH STENT PLACEMENT    . ESOPHAGOGASTRODUODENOSCOPY    . SHOULDER ARTHROSCOPY Left 08/15/2014   Procedure: LEFT ARTHROSCOPY SHOULDER WITH ACROMIO CLAVICULAR RESECTION;  Surgeon: Verlee Rossetti, MD;  Location: [facility] OR;  Service: Orthopedics;  Laterality: Left;  Marland Kitchen VASECTOMY    . WISDOM TOOTH EXTRACTION      Family history: Family History  Problem Relation Age of Onset  . Hypertension Father   . Allergic rhinitis Sister   . Hypertension  Other   . Heart disease Other   . Angioedema Neg Hx   . Asthma Neg Hx   . Eczema Neg Hx   . Immunodeficiency Neg Hx   . Urticaria Neg Hx     Social history: Social History   Socioeconomic History  . Marital status: Married    Spouse name: Not on file  . Number of children: Not on file  . Years of education: Not on file  . Highest education level: Not on file  Occupational History  . Not on file  Tobacco Use  . Smoking status: Never Smoker  . Smokeless tobacco: Never Used  Vaping Use  . Vaping Use: Never used  Substance and Sexual Activity  . Alcohol use: No    Comment: social- rare  . Drug use: No  . Sexual activity: Yes  Other Topics Concern  . Not on file  Social History Narrative   ** Merged History Encounter **       Social Determinants  of Health   Financial Resource Strain:   . Difficulty of Paying Living Expenses:   Food Insecurity:   . Worried About Programme researcher, broadcasting/film/video in the Last Year:   . Barista in the Last Year:   Transportation Needs:   . Freight forwarder (Medical):   Marland Kitchen Lack of Transportation (Non-Medical):   Physical Activity:   . Days of Exercise per Week:   . Minutes of Exercise per Session:   Stress:   . Feeling of Stress :   Social Connections:   . Frequency of Communication with Friends and Family:   . Frequency of Social Gatherings with Friends and Family:   . Attends Religious Services:   . Active Member of Clubs or Organizations:   . Attends Banker Meetings:   Marland Kitchen Marital Status:   Intimate Partner Violence:   . Fear of Current or Ex-Partner:   . Emotionally Abused:   Marland Kitchen Physically Abused:   . Sexually Abused:     Environmental History: The patient lives in a 55 year old house with carpeting throughout, gas heat, and central air.  There is mold/water damage in the home.  There are 2 dogs in the home which have access to his bedroom.  He is a non-smoker.  Current Outpatient Medications  Medication Sig Dispense Refill  . amphetamine-dextroamphetamine (ADDERALL) 5 MG tablet Take 5 mg by mouth as needed.     Marland Kitchen aspirin 81 MG tablet Take 81 mg by mouth daily.    . Cholecalciferol (VITAMIN D) 2000 units tablet Take by mouth.    . clonazePAM (KLONOPIN) 1 MG tablet Take 1 mg by mouth 2 (two) times daily as needed for anxiety.    . Cyanocobalamin (VITAMIN B-12 CR) 1000 MCG TBCR Take as directed.    Marland Kitchen esomeprazole (NEXIUM) 40 MG capsule Take 1 capsule (40 mg total) by mouth daily. 30 capsule 5  . fexofenadine (ALLEGRA) 60 MG tablet Take 60 mg by mouth 2 (two) times daily.    . Ginkgo Biloba Extract (GINKGO BILOBA MEMORY ENHANCER) 60 MG CAPS Take 1 tablet by mouth 2 (two) times daily.    Marland Kitchen lamoTRIgine (LAMICTAL) 100 MG tablet Take 125 mg by mouth daily.    Marland Kitchen lamoTRIgine  (LAMICTAL) 25 MG tablet 125 mg at bedtime.    . mometasone (NASONEX) 50 MCG/ACT nasal spray ONE SPRAY EACH NOSTRIL 1-2 TIMES  DAY AS NEEDED FOR NASAL CONGESTION OR DRAINAGE. 17 g 0  . Multiple  Vitamin (MULTI-VITAMINS) TABS Frequency:daily   Dosage:0.0     Instructions:Multivitamin ( TABS, 1 Oral daily)  Note:    . olmesartan (BENICAR) 40 MG tablet Take by mouth.    . Olopatadine HCl (PATADAY) 0.2 % SOLN as needed.    . propranolol (INDERAL) 40 MG tablet as needed.    . rosuvastatin (CRESTOR) 10 MG tablet Take by mouth.    . tadalafil (CIALIS) 5 MG tablet Take by mouth.    . Testosterone 30 MG/ACT SOLN Apply 20 mg topically.    . vitamin C (ASCORBIC ACID) 500 MG tablet 500 mg.    . zolpidem (AMBIEN CR) 12.5 MG CR tablet Take by mouth as needed.    . Budeson-Glycopyrrol-Formoterol (BREZTRI AEROSPHERE) 160-9-4.8 MCG/ACT AERO Inhale 2 puffs into the lungs in the morning and at bedtime. Use with spacer. Rinse, gargle and spit out after use. 10.7 g 5  . Carbinoxamine Maleate 4 MG TABS Take 4 mg every 6-8 hours as needed 28 tablet 5  . Fluticasone Propionate (XHANCE) 93 MCG/ACT EXHU Place 2 application into the nose in the morning and at bedtime. 16 mL 5  . ibuprofen (ADVIL,MOTRIN) 600 MG tablet Take 1 tablet (600 mg total) by mouth every 6 (six) hours as needed for pain. (Patient not taking: Reported on 05/21/2020) 30 tablet 0   Current Facility-Administered Medications  Medication Dose Route Frequency Provider Last Rate Last Admin  . predniSONE (DELTASONE) tablet 10 mg  10 mg Oral UD Qamar Aughenbaugh, Heywood Ilesalph Carter, MD        Known medication allergies: Allergies  Allergen Reactions  . Amoxicillin Diarrhea  . Flomax [Tamsulosin Hcl] Palpitations  . Flomax [Tamsulosin Hcl] Palpitations  . Tamsulosin Palpitations    I appreciate the opportunity to take part in Vang's care. Please do not hesitate to contact me with questions.  Sincerely,   R. Jorene Guestarter Midori Dado, MD

## 2020-05-21 NOTE — Assessment & Plan Note (Signed)
   The patient has been made aware of the elevated blood pressure reading and has been encouraged to follow up with his primary care physician in the near future regarding this issue.  Ryan Baker has verbalized understanding and agreed to do so.

## 2020-05-21 NOTE — Assessment & Plan Note (Signed)
   Continue appropriate lifestyle modifications.  A refill prescription has been provided for esomeprazole 40 mg daily.

## 2020-05-21 NOTE — Assessment & Plan Note (Addendum)
   Aeroallergen avoidance measures have been discussed and provided in written form.  A prescription has been provided for [facility], 2 actuations per nostril twice a day. Proper technique has been discussed and demonstrated.  Nasal saline lavage (NeilMed) has been recommended as needed and prior to medicated nasal sprays along with instructions for proper administration.  A prescription has been provided for carbinoxamine 4 mg every 6-8 hours if needed.  For thick post nasal drainage, add guaifenesin 1200 mg (Mucinex Maximum Strength)  twice daily as needed with adequate hydration as discussed.  If allergen avoidance measures and medications fail to adequately relieve symptoms, aeroallergen immunotherapy will be considered.

## 2020-05-21 NOTE — Patient Instructions (Addendum)
Asthma with acute exacerbation  Depo-Medrol 80 mg was administered in the office.  Prednisone has been provided and is to be started tomorrow as follows: 20 mg daily x 4 days, 10 mg x1 day, then stop.  A prescription has been provided for BrezTri 160 mcg, 2 inhalations twice daily.  To maximize pulmonary deposition, a spacer has been provided along with instructions for its proper administration with an HFA inhaler.  Albuterol via spacer device every 4-6 hours if needed.  The patient has been asked to contact me if his symptoms persist or progress. Otherwise, he may return for follow up in 2 months.  Allergic rhinitis  Aeroallergen avoidance measures have been discussed and provided in written form.  A prescription has been provided for [facility], 2 actuations per nostril twice a day. Proper technique has been discussed and demonstrated.  Nasal saline lavage (NeilMed) has been recommended as needed and prior to medicated nasal sprays along with instructions for proper administration.  A prescription has been provided for carbinoxamine 4 mg every 6-8 hours if needed.  For thick post nasal drainage, add guaifenesin 1200 mg (Mucinex Maximum Strength)  twice daily as needed with adequate hydration as discussed.  If allergen avoidance measures and medications fail to adequately relieve symptoms, aeroallergen immunotherapy will be considered.  GERD (gastroesophageal reflux disease)  Continue appropriate lifestyle modifications.  A refill prescription has been provided for esomeprazole 40 mg daily.  Elevated blood-pressure reading without diagnosis of hypertension  The patient has been made aware of the elevated blood pressure reading and has been encouraged to follow up with his primary care physician in the near future regarding this issue.  [name] has verbalized understanding and agreed to do so.   Return in about 2 months (around 07/22/2020), or if symptoms worsen or fail to  improve.  Control of Dust Mite Allergen  House dust mites play a major role in allergic asthma and rhinitis.  They occur in environments with high humidity wherever human skin, the food for dust mites is found. High levels have been detected in dust obtained from mattresses, pillows, carpets, upholstered furniture, bed covers, clothes and soft toys.  The principal allergen of the house dust mite is found in its feces.  A gram of dust may contain 1,000 mites and 250,000 fecal particles.  Mite antigen is easily measured in the air during house cleaning activities.    1. Encase mattresses, including the box spring, and pillow, in an air tight cover.  Seal the zipper end of the encased mattresses with wide adhesive tape. 2. Wash the bedding in water of 130 degrees Farenheit weekly.  Avoid cotton comforters/quilts and flannel bedding: the most ideal bed covering is the dacron comforter. 3. Remove all upholstered furniture from the bedroom. 4. Remove carpets, carpet padding, rugs, and non-washable window drapes from the bedroom.  Wash drapes weekly or use plastic window coverings. 5. Remove all non-washable stuffed toys from the bedroom.  Wash stuffed toys weekly. 6. Have the room cleaned frequently with a vacuum cleaner and a damp dust-mop.  The patient should not be in a room which is being cleaned and should wait 1 hour after cleaning before going into the room. 7. Close and seal all heating outlets in the bedroom.  Otherwise, the room will become filled with dust-laden air.  An electric heater can be used to heat the room. Reduce indoor humidity to less than 50%.  Do not use a humidifier.   Reducing Pollen Exposure  The  American Academy of Allergy, Asthma and Immunology suggests the following steps to reduce your exposure to pollen during allergy seasons.    1. Do not hang sheets or clothing out to dry; pollen may collect on these items. 2. Do not mow lawns or spend time around freshly cut grass;  mowing stirs up pollen. 3. Keep windows closed at night.  Keep car windows closed while driving. 4. Minimize morning activities outdoors, a time when pollen counts are usually at their highest. 5. Stay indoors as much as possible when pollen counts or humidity is high and on windy days when pollen tends to remain in the air longer. 6. Use air conditioning when possible.  Many air conditioners have filters that trap the pollen spores. 7. Use a HEPA room air filter to remove pollen form the indoor air you breathe.   Control of Dog or Cat Allergen  Avoidance is the best way to manage a dog or cat allergy. If you have a dog or cat and are allergic to dog or cats, consider removing the dog or cat from the home. If you have a dog or cat but don't want to find it a new home, or if your family wants a pet even though someone in the household is allergic, here are some strategies that may help keep symptoms at bay:  1. Keep the pet out of your bedroom and restrict it to only a few rooms. Be advised that keeping the dog or cat in only one room will not limit the allergens to that room. 2. Don't pet, hug or kiss the dog or cat; if you do, wash your hands with soap and water. 3. High-efficiency particulate air (HEPA) cleaners run continuously in a bedroom or living room can reduce allergen levels over time. 4. Place electrostatic material sheet in the air inlet vent in the bedroom. 5. Regular use of a high-efficiency vacuum cleaner or a central vacuum can reduce allergen levels. 6. Giving your dog or cat a bath at least once a week can reduce airborne allergen.   Control of Mold Allergen  Mold and fungi can grow on a variety of surfaces provided certain temperature and moisture conditions exist.  Outdoor molds grow on plants, decaying vegetation and soil.  The major outdoor mold, Alternaria and Cladosporium, are found in very high numbers during hot and dry conditions.  Generally, a late Summer - Fall  peak is seen for common outdoor fungal spores.  Rain will temporarily lower outdoor mold spore count, but counts rise rapidly when the rainy period ends.  The most important indoor molds are Aspergillus and Penicillium.  Dark, humid and poorly ventilated basements are ideal sites for mold growth.  The next most common sites of mold growth are the bathroom and the kitchen.  Outdoor [employer] 1. Use air conditioning and keep windows closed 2. Avoid exposure to decaying vegetation. 3. Avoid leaf raking. 4. Avoid grain handling. 5. Consider wearing a face mask if working in moldy areas.  Indoor Mold Control 1. Maintain humidity below 50%. 2. Clean washable surfaces with 5% bleach solution. 3. Remove sources e.g. Contaminated carpets.   Control of Cockroach Allergen  Cockroach allergen has been identified as an important cause of acute attacks of asthma, especially in urban settings.  There are fifty-five species of cockroach that exist in the [place], however only three, the [place], [place] species produce allergen that can affect patients with Asthma.  Allergens can be obtained from fecal  particles, egg casings and secretions from cockroaches.    1. Remove food sources. 2. Reduce access to water. 3. Seal access and entry points. 4. Spray runways with 0.5-1% Diazinon or Chlorpyrifos 5. Blow boric acid power under stoves and refrigerator. 6. Place bait stations (hydramethylnon) at feeding sites.

## 2020-05-21 NOTE — Assessment & Plan Note (Signed)
   Depo-Medrol 80 mg was administered in the office.  Prednisone has been provided and is to be started tomorrow as follows: 20 mg daily x 4 days, 10 mg x1 day, then stop.  A prescription has been provided for BrezTri 160 mcg, 2 inhalations twice daily.  To maximize pulmonary deposition, a spacer has been provided along with instructions for its proper administration with an HFA inhaler.  Albuterol via spacer device every 4-6 hours if needed.  The patient has been asked to contact me if his symptoms persist or progress. Otherwise, he may return for follow up in 2 months.

## 2020-05-27 ENCOUNTER — Telehealth: Payer: Self-pay | Admitting: Allergy and Immunology

## 2020-05-27 NOTE — Telephone Encounter (Signed)
Please advise 

## 2020-05-27 NOTE — Telephone Encounter (Signed)
Wheezing while at work , taking albuterol but does not work causes him to cough.  Finished pack of prednisone yesterday.  Taking all prescribed medicines and still having volume restriction.  After breathing test last week he was given inhaler that was  in office (worked great for him)

## 2020-05-27 NOTE — Telephone Encounter (Signed)
Please send in a prescription for levalbuterol (Xopenex) HFA, 1 to 2 inhalations every 4-6 hours if needed.  You may need to put in a prior authorization stating that he has tried albuterol without benefit.

## 2020-05-28 MED ORDER — LEVALBUTEROL TARTRATE 45 MCG/ACT IN AERO: 2.0000 | INHALATION_SPRAY | Freq: Four times a day (QID) | RESPIRATORY_TRACT | 2 refills | Status: DC | PRN

## 2020-05-28 NOTE — Telephone Encounter (Signed)
Sent X[facility]ex into [facility]

## 2020-06-16 ENCOUNTER — Encounter: Payer: Self-pay | Admitting: Family Medicine

## 2020-06-17 ENCOUNTER — Telehealth: Payer: Self-pay | Admitting: Allergy and Immunology

## 2020-06-17 NOTE — Telephone Encounter (Signed)
Yes you can put him in for first available follow-up.  You can check with him and see if he is okay with seeing a nurse practitioner knowing that I will hear the history and oversee the plan. Thanks.

## 2020-06-17 NOTE — Telephone Encounter (Signed)
Pt still having breathing issues, coughing, wheezing related to mold allergy per Dr. Nunzio Cobbs. Asking if he can do follow up with Dr. Nunzio Cobbs, or what he recommends. Pt states he can take first available appt.

## 2020-06-17 NOTE — Telephone Encounter (Signed)
Patient informed. He will call tomorrow morning to see what we have available with Thermon Leyland, FNP.

## 2020-06-17 NOTE — Telephone Encounter (Signed)
Please advise 

## 2020-06-19 ENCOUNTER — Other Ambulatory Visit: Payer: Self-pay

## 2020-06-19 ENCOUNTER — Ambulatory Visit (HOSPITAL_BASED_OUTPATIENT_CLINIC_OR_DEPARTMENT_OTHER)
Admission: RE | Admit: 2020-06-19 | Discharge: 2020-06-19 | Disposition: A | Discharge status: Home / Self Care | Payer: No Typology Code available for payment source | Source: Ambulatory Visit | Attending: Allergy and Immunology | Admitting: Allergy and Immunology

## 2020-06-19 ENCOUNTER — Telehealth: Payer: Self-pay | Admitting: *Deleted

## 2020-06-19 DIAGNOSIS — R059 Cough, unspecified: Secondary | ICD-10-CM

## 2020-06-19 DIAGNOSIS — J45901 Unspecified asthma with (acute) exacerbation: Secondary | ICD-10-CM

## 2020-06-19 MED ORDER — FLOVENT HFA 220 MCG/ACT IN AERO
2.0000 | INHALATION_SPRAY | Freq: Two times a day (BID) | RESPIRATORY_TRACT | 5 refills | Status: DC
Start: 2020-06-19 — End: 2020-07-03

## 2020-06-19 NOTE — Telephone Encounter (Signed)
Refer to previous Mychart message. CXR, labs and flovent have been ordered.   Also Geraldine Contras could you please refer to Chilton Greathouse, MD (pulmonology) for shortness of breath, wheezing, coughing. Also pt noted that Dr. Nunzio Cobbs was concerned about Lung scaring and would like this to be done ASAP.   Thank you.

## 2020-06-23 LAB — ASPERGILLUS IGE PANEL

## 2020-06-23 LAB — CBC WITH DIFFERENTIAL/PLATELET
Basophils Absolute: 0.1 10*3/uL (ref 0.0–0.2)
Hematocrit: 42.5 % (ref 37.5–51.0)
Hemoglobin: 14.3 g/dL (ref 13.0–17.7)
Immature Granulocytes: 0 %

## 2020-06-23 LAB — IGE: IgE (Immunoglobulin E), Serum: 57 IU/mL (ref 6–495)

## 2020-06-23 LAB — ALLERGEN A FUMIGATUS IGG: Aspergillus fumigatus IgG: 19.5 ug/mL — ABNORMAL HIGH (ref 0.0–1.9)

## 2020-06-27 LAB — CBC WITH DIFFERENTIAL/PLATELET
Basos: 1 %
EOS (ABSOLUTE): 0.1 10*3/uL (ref 0.0–0.4)
Eos: 1 %
Immature Grans (Abs): 0 10*3/uL (ref 0.0–0.1)
Lymphocytes Absolute: 2.2 10*3/uL (ref 0.7–3.1)
Lymphs: 34 %
MCH: 31.5 pg (ref 26.6–33.0)
MCHC: 33.6 g/dL (ref 31.5–35.7)
MCV: 94 fL (ref 79–97)
Monocytes Absolute: 0.7 10*3/uL (ref 0.1–0.9)
Monocytes: 11 %
Neutrophils Absolute: 3.4 10*3/uL (ref 1.4–7.0)
Neutrophils: 53 %
Platelets: 211 10*3/uL (ref 150–450)
RBC: 4.54 x10E6/uL (ref 4.14–5.80)
RDW: 12.3 % (ref 11.6–15.4)
WBC: 6.4 10*3/uL (ref 3.4–10.8)

## 2020-06-27 LAB — ASPERGILLUS IGE PANEL
A. Amstel/Glaucu Class Interp: 0
A. Flavus Class Interp: 0
A. Niger Class Interp: 0
A. Versicolor Class Interp: 0
Aspergillus amstel/glaucu IgE*: <0.35 kU/L (ref <0.35)
Aspergillus flavus IgE: <0.35 kU/L (ref <0.35)
Aspergillus nidulans IgE: <0.35 kU/L (ref <0.35)
Aspergillus versicolor IgE: <0.1 kU/L (ref <0.35)

## 2020-06-27 LAB — SAR COV2 SEROLOGY (COVID19)AB(IGG),IA: DiaSorin SARS-CoV-2 Ab, IgG: NEGATIVE

## 2020-07-02 ENCOUNTER — Telehealth: Payer: Self-pay

## 2020-07-02 NOTE — Telephone Encounter (Signed)
I do not know Dr. Judeth Horn. However, don't let his years in the practice (or lack thereof) deter you, he may be the one who unveils the etiology - and if not, your foot will be in the door with the practice. If Ryan Baker's symptoms start to progress, or if he gets fevers or chills, we can check a chest x-ray. Thanks

## 2020-07-02 NOTE — Telephone Encounter (Signed)
Gibson Ramp J "Tim"  Bobbitt, Heywood Iles, MD 14 minutes ago (4:04 PM)   Hi Dr. Nunzio Cobbs,  Following up on message left Monday.  Update, I am caught between not being able to see Dr. Lacie Draft until a distant 08/14/20 appointment (more than a month and 1/2  away) and your suggestion to see someone as soon as possible.   Today [facility] offered to see me tomorrow morning at 9 a.m with a brand new doctor.  He is inexperienced from what I understand.  The concern is especially significant since you said this is  an unusual case.  New Doc is Vilma Meckel, MD.   Lastly, do you need to blood test/chest x-ray Melanee Sp[facility]), since he and I have had the same symptoms?  He is somewhat better but still not back to normal.   Thank you.  Please advise

## 2020-07-03 ENCOUNTER — Encounter: Payer: Self-pay | Admitting: Pulmonary Disease

## 2020-07-03 ENCOUNTER — Other Ambulatory Visit (INDEPENDENT_AMBULATORY_CARE_PROVIDER_SITE_OTHER): Payer: 59

## 2020-07-03 ENCOUNTER — Ambulatory Visit (INDEPENDENT_AMBULATORY_CARE_PROVIDER_SITE_OTHER): Payer: 59 | Admitting: Pulmonary Disease

## 2020-07-03 ENCOUNTER — Other Ambulatory Visit: Payer: Self-pay

## 2020-07-03 VITALS — BP 144/82 | HR 91 | Temp 96.7°F | Ht 69.0 in | Wt 151.8 lb

## 2020-07-03 DIAGNOSIS — J4540 Moderate persistent asthma, uncomplicated: Secondary | ICD-10-CM

## 2020-07-03 DIAGNOSIS — R9389 Abnormal findings on diagnostic imaging of other specified body structures: Secondary | ICD-10-CM

## 2020-07-03 LAB — CBC WITH DIFFERENTIAL/PLATELET
Basophils Absolute: 0.1 K/uL (ref 0.0–0.1)
Basophils Relative: 1 % (ref 0.0–3.0)
Eosinophils Absolute: 0.1 K/uL (ref 0.0–0.7)
Eosinophils Relative: 1.9 % (ref 0.0–5.0)
HCT: 44.1 % (ref 39.0–52.0)
Hemoglobin: 15.7 g/dL (ref 13.0–17.0)
Lymphocytes Relative: 31.1 % (ref 12.0–46.0)
Lymphs Abs: 1.9 K/uL (ref 0.7–4.0)
MCHC: 35.6 g/dL (ref 30.0–36.0)
MCV: 92.4 fl (ref 78.0–100.0)
Monocytes Absolute: 0.6 K/uL (ref 0.1–1.0)
Monocytes Relative: 10.4 % (ref 3.0–12.0)
Neutro Abs: 3.4 K/uL (ref 1.4–7.7)
Neutrophils Relative %: 55.6 % (ref 43.0–77.0)
Platelets: 223 K/uL (ref 150.0–400.0)
RBC: 4.77 Mil/uL (ref 4.22–5.81)
RDW: 13.1 % (ref 11.5–15.5)
WBC: 6.1 K/uL (ref 4.0–10.5)

## 2020-07-03 MED ORDER — TRELEGY ELLIPTA 200-62.5-25 MCG/INH IN AEPB: 1.0000 | INHALATION_SPRAY | Freq: Every day | RESPIRATORY_TRACT | 12 refills | Status: DC

## 2020-07-03 NOTE — Progress Notes (Signed)
Patient ID: Ryan Baker, male    DOB: 1966/04/30, 54 y.o.   MRN: [id number]  Chief Complaint  Patient presents with  . Consult    referred by Dr Nunzio CobbsBobbitt for shob, having cough, chest congestion, possible mold exposure    Referring provider: Bobbitt, Ryan Baker, *  HPI:   Ryan Baker is a 54 y.o. male with past medical history of sinusitis and allergic asthma we are sending consultation at the request of Ryan Schatzalph Bobbitt, Ryan Baker for evaluation of cough, dyspnea on exertion.  Notes from referring provider reviewed.  Chief complaint today is dyspnea on exertion.  Patient has longstanding history with allergist Dr. Nunzio CobbsBobbitt.  Has been treated in the past for sinusitis and asthma.  Seems disease has been well controlled without controller medicine since 2017 per review of EMR.  He and his son were doing work in [employer] under house in June when they both experience what sounds like relatively acute onset of chest tightness, burning sensation in lungs, severe shortness of breath, and cough.  In addition t patient reports some malaise and fatigue.  He had been seen by urgent care and told chest x-ray was normal and prescribed a course of azithromycin and a several day up to 2-week course of prednisone.  Unfortunately he did not improve.  He found this particularly this in the past course of steroids have been greatly beneficial in his breathing and sinus disease.  He was seen by his allergist who was concerned for acute exacerbation of asthma.  Possible environmental or inhalational trigger.  He was given a shot of Solu-Medrol and sent on a 5-day course of tapered prednisone.  In addition, he was prescribed ICS/LAMA/LABA inhaler via Breztri.  Conflicting information regarding how helpful this was.  Follow-up telephone encounter is indicated for been some improvement in his shortness of breath and cough however he reports that he was experiencing some voice changes, worsening chest discomfort, and some  worsening malaise and fatigue while on the inhaler so he stopped it shortly after initiation.  He has had poor sleep.  Initially, he notes his blood pressure has been elevated surrounding the event and has needed to add a second antihypertensive to better control his blood pressure.  Overall, over the course of several weeks symptoms have gradually improved.  Cough is largely resolved but still somewhat present.  Malaise and fatigue have largely resolved.  However, he does have significant exertional changes.  He is an avid exerciser primarily swimming and finds that he can only swim at half pace and is quite symptomatic from a dyspnea standpoint.  In addition he notices when he is out doing activities such as golf that he becomes winded and fatigued quite quickly which is very unusual for him.  His work-up to date has included pulmonary function test which was personally reviewed interpreted as normal spirometry with significant 600 cc of 15% increase in FVC and 700 cc or 22% increase in FEV1 after bronchodilator.  Chest x-ray was largely clear with the exception of a small infiltrate at the lateral base of the lung.  Lab revealed total eosinophil count 100, normal IgE level, negative Aspergillus IgE panel with slightly elevated Aspergillus IgG.   PMH: Allergic rhinitis, allergic asthma, sinusitis Surgical history: PCI to coronary artery Family history: Heart disease in father Social history: Never smoker, rarely drinks[place], works as a Veterinary surgeoncounselor in [employer]  Questionaires / Pulmonary Flowsheets:   ACT:  No flowsheet data found.  MMRC:  No flowsheet data found.  Epworth:  No flowsheet data found.  Tests:   FENO:  No results found for: NITRICOXIDE  PFT: No flowsheet data found.  WALK:  No flowsheet data found.  Imaging: DG Chest 2 View  Result Date: 06/20/2020 CLINICAL DATA:  Cough and shortness of breath after multi exposure/inhalation EXAM: CHEST - 2 VIEW  COMPARISON:  Radiograph 08/14/2014, CT a 06/18/2014 FINDINGS: Very mild airways thickening. No consolidation, features of edema, pneumothorax, or effusion. Pulmonary vascularity is normally distributed. The cardiomediastinal contours are unremarkable. No acute osseous or soft tissue abnormality. IMPRESSION: Mild airways thickening could reflect bronchitic change or reactive airways disease. No other acute cardiopulmonary abnormality. Electronically Signed   By: Kreg Shropshire M.D.   On: 06/20/2020 21:40    Lab Results: Personally reviewed and as per EMR, notably dating back to as far as 2015 after eosinophil count never elevated above 200 and usually in the 100 range. CBC    Component Value Date/Time   WBC 6.4 06/19/2020 1638   WBC 5.6 08/14/2014 1511   RBC 4.54 06/19/2020 1638   RBC 4.53 08/14/2014 1511   HGB 14.3 06/19/2020 1638   HCT 42.5 06/19/2020 1638   PLT 211 06/19/2020 1638   MCV 94 06/19/2020 1638   MCH 31.5 06/19/2020 1638   MCH 31.8 08/14/2014 1511   MCHC 33.6 06/19/2020 1638   MCHC 36.3 (H) 08/14/2014 1511   RDW 12.3 06/19/2020 1638   LYMPHSABS 2.2 06/19/2020 1638   MONOABS 1.0 08/15/2013 2357   EOSABS 0.1 06/19/2020 1638   BASOSABS 0.1 06/19/2020 1638    BMET    Component Value Date/Time   NA 137 08/14/2014 1511   K 3.8 08/14/2014 1511   CL 100 08/14/2014 1511   CO2 26 08/14/2014 1511   GLUCOSE 108 (H) 08/14/2014 1511   BUN 10 08/14/2014 1511   CREATININE 0.94 08/14/2014 1511   CALCIUM 9.4 08/14/2014 1511   GFRNONAA >90 08/14/2014 1511   GFRAA >90 08/14/2014 1511    BNP No results found for: BNP  ProBNP No results found for: PROBNP  Specialty Problems      Pulmonary Problems   Asthma with acute exacerbation   Allergic rhinitis    Overview:  With either intermittent acute sinusitis or chronic sinusitis with intermittent acute flares.      Breath shortness   Acute sinusitis      Allergies  Allergen Reactions  . Amoxicillin Diarrhea  . Flomax  [Tamsulosin Hcl] Palpitations  . Flomax [Tamsulosin Hcl] Palpitations  . Tamsulosin Palpitations    Immunization History  Administered Date(s) Administered  . Pneumococcal Polysaccharide-23 01/04/2016  . Tdap 06/04/2019    Past Medical History:  Diagnosis Date  . Anxiety    generalized anxiety disorder  . Asthma   . Coronary artery disease   . Depression   . Eczema of scalp   . Enlarged prostate   . Family history of anesthesia complication    Father vomits  . GERD (gastroesophageal reflux disease)   . Hyperlipemia   . Prostatitis     Tobacco History: Social History   Tobacco Use  Smoking Status Never Smoker  Smokeless Tobacco Never Used   Counseling given: Not Answered   Continue to not smoke  Outpatient Encounter Medications as of 07/03/2020  Medication Sig  . amLODipine (NORVASC) 2.5 MG tablet Take by mouth.  Marland Kitchen amphetamine-dextroamphetamine (ADDERALL) 5 MG tablet Take 5 mg by mouth as needed.   Marland Kitchen aspirin 81 MG tablet Take  81 mg by mouth daily.  . Carbinoxamine Maleate 4 MG TABS Take 4 mg every 6-8 hours as needed  . Cholecalciferol (VITAMIN D) 2000 units tablet Take by mouth.  . clonazePAM (KLONOPIN) 1 MG tablet Take 1 mg by mouth 2 (two) times daily as needed for anxiety.  . Cyanocobalamin (VITAMIN B-12 CR) 1000 MCG TBCR Take as directed.  Marland Kitchen esomeprazole (NEXIUM) 40 MG capsule Take 1 capsule (40 mg total) by mouth daily.  . fexofenadine (ALLEGRA) 60 MG tablet Take 60 mg by mouth 2 (two) times daily.  . Ginkgo Biloba Extract (GINKGO BILOBA MEMORY ENHANCER) 60 MG CAPS Take 1 tablet by mouth 2 (two) times daily.  Marland Kitchen ibuprofen (ADVIL,MOTRIN) 600 MG tablet Take 1 tablet (600 mg total) by mouth every 6 (six) hours as needed for pain.  Marland Kitchen lamoTRIgine (LAMICTAL) 25 MG tablet 125 mg at bedtime.  . mometasone (NASONEX) 50 MCG/ACT nasal spray ONE SPRAY EACH NOSTRIL 1-2 TIMES  DAY AS NEEDED FOR NASAL CONGESTION OR DRAINAGE.  . Multiple Vitamin (MULTI-VITAMINS) TABS  Frequency:daily   Dosage:0.0     Instructions:Multivitamin ( TABS, 1 Oral daily)  Note:  . olmesartan (BENICAR) 40 MG tablet Take by mouth.  . Olopatadine HCl (PATADAY) 0.2 % SOLN as needed.  . rosuvastatin (CRESTOR) 10 MG tablet Take by mouth.  . tadalafil (CIALIS) 5 MG tablet Take by mouth.  . Testosterone 30 MG/ACT SOLN Apply 20 mg topically.  . vitamin C (ASCORBIC ACID) 500 MG tablet 500 mg.  . zolpidem (AMBIEN CR) 12.5 MG CR tablet Take by mouth as needed.  . fluticasone (FLOVENT HFA) 220 MCG/ACT inhaler Inhale 2 puffs into the lungs 2 (two) times daily. (Patient not taking: Reported on 07/03/2020)  . Fluticasone Propionate (XHANCE) 93 MCG/ACT EXHU Place 2 application into the nose in the morning and at bedtime. (Patient not taking: Reported on 07/03/2020)  . Fluticasone-Umeclidin-Vilant (TRELEGY ELLIPTA) 200-62.5-25 MCG/INH AEPB Inhale 1 puff into the lungs daily.  Marland Kitchen lamoTRIgine (LAMICTAL) 100 MG tablet Take 125 mg by mouth daily.  Marland Kitchen levalbuterol (XOPENEX HFA) 45 MCG/ACT inhaler Inhale 2 puffs into the lungs every 6 (six) hours as needed for wheezing. (Patient not taking: Reported on 07/03/2020)  . propranolol (INDERAL) 40 MG tablet as needed.  . [DISCONTINUED] Budeson-Glycopyrrol-Formoterol (BREZTRI AEROSPHERE) 160-9-4.8 MCG/ACT AERO Inhale 2 puffs into the lungs in the morning and at bedtime. Use with spacer. Rinse, gargle and spit out after use.   Facility-Administered Encounter Medications as of 07/03/2020  Medication  . predniSONE (DELTASONE) tablet 10 mg     Review of Systems  Review of Systems  Has chest tingling or burning or description of spider webs in the central area of his chest when breathing deeply, no exertional chest pain otherwise, no orthopnea or PND, no lower extremity edema, comprehensive review of systems otherwise negative Physical Exam  BP (!) 144/82 (BP Location: Right Arm, Cuff Size: Normal)   Pulse 91   Temp (!) 96.7 F (35.9 C) (Temporal)   Ht 5\' 9"   (1.753 m)   Wt 151 lb 12.8 oz (68.9 kg)   SpO2 98%   BMI 22.42 kg/m   Wt Readings from Last 5 Encounters:  07/03/20 151 lb 12.8 oz (68.9 kg)  05/21/20 153 lb 3.5 oz (69.5 kg)  12/29/15 163 lb 3.2 oz (74 kg)  08/15/14 167 lb (75.8 kg)  08/14/14 167 lb 12.3 oz (76.1 kg)    BMI Readings from Last 5 Encounters:  07/03/20 22.42 kg/m  05/21/20 22.80 kg/m  12/29/15 23.96 kg/m  08/15/14 24.66 kg/m  08/14/14 24.78 kg/m     Physical Exam General: Well-appearing, fit, in no acute distress Eyes: EOMI, no icterus Neck: No JVP appreciated sitting upright, Respiratory: Clear to auscultation bilaterally, very good air movement bilaterally, no wheezing or Respiratory: Regular rate and rhythm, no murmurs Abdomen: Soft, bowel sound present MSK: No synovitis, no joint fusions Neuro: Normal gait, no weakness, Psych: Normal mood, full affect   Assessment & Plan:   Mr. Collingsworth is a 54 y.o. male with past medical history of sinusitis and allergic asthma we are sending consultation at the request of Ryan Schatz, Ryan Baker for evaluation of cough, dyspnea on exertion.  Patient has underlying asthma that appears active in the setting of current illness given significant bronchodilator response on PFT 05/21/2020.  Possible exposure or infectious agent associated with working under his house has triggered this.  While he did not have profound rapid response to initial steroid course, is quite possible his gradual improvement in symptoms are related to repeat steroid dosing.  Counseled that we should be aggressive in treating underlying asthma to see if this will improve his sensation of dyspnea and chest tightness.  He had unacceptable side effects to breztri and given the severity of his symptoms and multiple steroid courses, recommended triple therapy ICS/LABA/LAMA with Trelegy.  Prescription provided today.  Suspect he will continue to improve given modest but gradual improvement with overall  symptoms.  However, given asthma and persistent symptoms some concern for ABPA.  Reassuringly, he has never had elevated eosinophil count.  IgE recently within normal limits.  Aspergillus IgE panel negative.  These were drawn about 3 weeks after last steroid dose.  We will repeat these labs today and they could have been falsely negative in the setting of steroid use although anticipate the results will be reassuring.  Given his sinus disease and asthma, EGPA considered but never has had elevated eosinophil count.  Additionally, his chest x-ray from 05/21/2020 monitor position with small left basilar opacity.  Most likely atelectasis however in the setting of prolonged symptoms do worry about something like an atypical pneumonia.  Given occurred with working in dirt, histoplasma and blasto antigens and serologies ordered today.  Lastly, CT chest ordered to better evaluate that infiltrate in the setting of his symptoms.     Return in about 2 months (around 09/02/2020).   Karren Burly, Ryan Baker 07/03/2020

## 2020-07-03 NOTE — Telephone Encounter (Signed)
Pt show dr Judeth Horn he ordered a ct scan on the lungs, and will fax [place] results when they come in. He done blood work to look to make sure prednisone has meesed up any numbers. He started him on trelogy inhaler.

## 2020-07-03 NOTE — Telephone Encounter (Signed)
Please see previous note from pt

## 2020-07-03 NOTE — Addendum Note (Signed)
Addended by: Jacquiline Doe on: 07/03/2020 11:39 AM   Modules accepted: Orders

## 2020-07-03 NOTE — Telephone Encounter (Signed)
Lm for pt to call us back  

## 2020-07-03 NOTE — Patient Instructions (Signed)
Nice to meet you!  Use trelegy 1 puff once a day. Rinse your mouth out after use. I hope this will help your breathing given you had a strong improvement after albuterol on the breathing testes from early July 2021. We will do a prior authourization if needed since you experienced side effects on Breztri.   We will get albs today.  Lastly, I ordered a CT scan of your chest to better evaluate the small shadow on the bottom part of your left lung.   Come back in 2 months with Ryan Baker.

## 2020-07-06 NOTE — Telephone Encounter (Signed)
Noted. Thanks.

## 2020-07-07 LAB — IGE: IgE (Immunoglobulin E), Serum: 55 kU/L (ref <=114)

## 2020-07-07 LAB — BLASTOMYCES AB, ID: Blastomyces Abs, Qn, DID: NEGATIVE

## 2020-07-08 ENCOUNTER — Other Ambulatory Visit: Payer: 59

## 2020-07-08 ENCOUNTER — Ambulatory Visit (HOSPITAL_BASED_OUTPATIENT_CLINIC_OR_DEPARTMENT_OTHER)
Admission: RE | Admit: 2020-07-08 | Discharge: 2020-07-08 | Disposition: A | Discharge status: Home / Self Care | Payer: No Typology Code available for payment source | Source: Ambulatory Visit | Attending: Pulmonary Disease | Admitting: Pulmonary Disease

## 2020-07-08 ENCOUNTER — Other Ambulatory Visit: Payer: Self-pay

## 2020-07-08 DIAGNOSIS — R9389 Abnormal findings on diagnostic imaging of other specified body structures: Secondary | ICD-10-CM | POA: Diagnosis present

## 2020-07-08 LAB — HISTOPLASMA ANTIGEN (BLD, CSF, BRONCH WASH, OTHER)
Interpretation:: NEGATIVE
RESULT:: NOT DETECTED ng/mL

## 2020-07-08 LAB — HISTOPLASMA ANTIBODIES: HISTOPLASMA ANTIBODY IMMUNODIFFUSION: NEGATIVE

## 2020-07-09 LAB — BLASTOMYCES ANTIGEN: Blastomyces Antigen: NOT DETECTED ng/mL

## 2020-07-14 LAB — ASPERGILLUS IGE PANEL
A. Amstel/Glaucu Class Interp: 0
A. Flavus Class Interp: 0
A. Fumigatus Class Interp: 0
A. Nidulans Class Interp: 0
A. Niger Class Interp: 0
A. Versicolor Class Interp: 0
Aspergillus amstel/glaucu IgE*: <0.35 kU/L (ref <0.35)
Aspergillus flavus IgE: <0.35 kU/L (ref <0.35)
Aspergillus fumigatus IgE: <0.1 kU/L (ref <0.35)
Aspergillus nidulans IgE: <0.35 kU/L (ref <0.35)
Aspergillus niger IgE: <0.1 kU/L (ref <0.35)
Aspergillus versicolor IgE: <0.1 kU/L (ref <0.35)

## 2020-08-05 ENCOUNTER — Institutional Professional Consult (permissible substitution): Payer: 59 | Admitting: Pulmonary Disease

## 2020-08-14 ENCOUNTER — Institutional Professional Consult (permissible substitution): Payer: 59 | Admitting: Pulmonary Disease

## 2020-08-19 ENCOUNTER — Other Ambulatory Visit: Payer: Self-pay

## 2020-08-19 ENCOUNTER — Ambulatory Visit (INDEPENDENT_AMBULATORY_CARE_PROVIDER_SITE_OTHER): Payer: 59 | Admitting: Pulmonary Disease

## 2020-08-19 ENCOUNTER — Encounter: Payer: Self-pay | Admitting: Pulmonary Disease

## 2020-08-19 VITALS — BP 126/74 | HR 85 | Temp 98.2°F | Ht 69.0 in | Wt 155.4 lb

## 2020-08-19 DIAGNOSIS — J4520 Mild intermittent asthma, uncomplicated: Secondary | ICD-10-CM

## 2020-08-19 DIAGNOSIS — R0609 Other forms of dyspnea: Secondary | ICD-10-CM

## 2020-08-19 DIAGNOSIS — R0600 Dyspnea, unspecified: Secondary | ICD-10-CM

## 2020-08-19 NOTE — Patient Instructions (Signed)
Nice to meet you!  Continue Trelegy.  I will message your PCP and the cardiology NP.  Come back in 4 months with Dr. Judeth Horn. Can discuss coming down on the inhaler then.

## 2020-08-19 NOTE — Progress Notes (Signed)
Patient ID: Ryan Baker, male    DOB: 10-15-1966, 54 y.o.   MRN: [id number]  Chief Complaint  Patient presents with  . Follow-up    Denies any problems with breathing at the moment    Referring provider: Cheron Schaumann.,*  History 08/19/2020: Patient returns for follow up of cough, DOE following exposure to unknown irritant.  Has been on trelegy for several weeks. Symptoms largely improved. Some intermittent chest tightness or sensation of enable to take ful breath at higher levels of exercise. Notes that symptoms have been present since exposure but also that his BP has been difficult to control since then and is associated time wise with respiratory symptoms.  Discussed at length the results of recent testing including norma chest CT, IgE, ABPA panel, eosinophil level off steroids.  HPI at initial Visit:  Ryan Baker is a 54 y.o. male with past medical history of sinusitis and allergic asthma we are sending consultation at the request of Candis Schatz, MD for evaluation of cough, dyspnea on exertion.  Notes from referring provider reviewed.  Chief complaint today is dyspnea on exertion.  Patient has longstanding history with allergist Dr. Nunzio Cobbs.  Has been treated in the past for sinusitis and asthma.  Seems disease has been well controlled without controller medicine since 2017 per review of EMR.  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  In addition t patient reports some malaise and fatigue.  He had been seen by urgent care and told chest x-ray was normal and prescribed a course of azithromycin and a several day up to 2-week course of prednisone.  Unfortunately he did not improve.  He found this particularly this in the past course of steroids have been greatly beneficial in his breathing and sinus  disease.  He was seen by his allergist who was concerned for acute exacerbation of asthma.  Possible environmental or inhalational trigger.  He was given a shot of Solu-Medrol and sent on a 5-day course of tapered prednisone.  In addition, he was prescribed ICS/LAMA/LABA inhaler via [facility].  Conflicting information regarding how helpful this was.  Follow-up telephone encounter is indicated for been some improvement in his shortness of breath and cough however he reports that he was experiencing some voice changes, worsening chest discomfort, and some worsening malaise and fatigue while on the inhaler so he stopped it shortly after initiation.  He has had poor sleep.  Initially, he notes his blood pressure has been elevated surrounding the event and has needed to add a second antihypertensive to better control his blood pressure.  Overall, over the course of several weeks symptoms have gradually improved.  Cough is largely resolved but still somewhat present.  Malaise and fatigue have largely resolved.  However, he does have significant exertional changes.  He is an avid exerciser primarily swimming and finds that he can only swim at half pace and is quite symptomatic from a dyspnea standpoint.  In addition he notices when he is out doing activities such as golf that he becomes winded and fatigued quite quickly which is very unusual for him.  His work-up to date has included pulmonary function test which was personally reviewed interpreted as normal spirometry with significant 600 cc of 15% increase in FVC and 700 cc or 22% increase in FEV1 after bronchodilator.  Chest  x-ray was largely clear with the exception of a small infiltrate at the lateral base of the lung.  Lab revealed total eosinophil count 100, normal IgE level, negative Aspergillus IgE panel with slightly elevated Aspergillus IgG.   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  MMRC: No flowsheet data found.  Epworth:  No flowsheet data found.  Tests:   FENO:  No results found for: NITRICOXIDE  PFT: No flowsheet data found.  WALK:  No flowsheet data found.  Imaging: Reviewed and as per EMR.   Lab Results: Personally reviewed and as per EMR, notably dating back to as far as 2015 after eosinophil count never elevated above 200 and usually in the 100 range. IgE WNL. CBC    Component Value Date/Time   WBC 6.1 07/03/2020 1102   RBC 4.77 07/03/2020 1102   HGB 15.7 07/03/2020 1102   HGB 14.3 06/19/2020 1638   HCT 44.1 07/03/2020 1102   HCT 42.5 06/19/2020 1638   PLT 223.0 07/03/2020 1102   PLT 211 06/19/2020 1638   MCV 92.4 07/03/2020 1102   MCV 94 06/19/2020 1638   MCH 31.5 06/19/2020 1638   MCH 31.8 08/14/2014 1511   MCHC 35.6 07/03/2020 1102   RDW 13.1 07/03/2020 1102   RDW 12.3 06/19/2020 1638   LYMPHSABS 1.9 07/03/2020 1102   LYMPHSABS 2.2 06/19/2020 1638   MONOABS 0.6 07/03/2020 1102   EOSABS 0.1 07/03/2020 1102   EOSABS 0.1 06/19/2020 1638   BASOSABS 0.1 07/03/2020 1102   BASOSABS 0.1 06/19/2020 1638    BMET    Component Value Date/Time   NA 137 08/14/2014 1511   K 3.8 08/14/2014 1511   CL 100 08/14/2014 1511   CO2 26 08/14/2014 1511   GLUCOSE 108 (H) 08/14/2014 1511   BUN 10 08/14/2014 1511   CREATININE 0.94 08/14/2014 1511   CALCIUM 9.4 08/14/2014 1511   GFRNONAA >90 08/14/2014 1511   GFRAA >90 08/14/2014 1511    BNP No results found for: BNP  ProBNP No results found for: PROBNP  Specialty Problems      Pulmonary Problems   Asthma with acute exacerbation   Allergic rhinitis    Overview:  With either intermittent acute sinusitis or chronic sinusitis with intermittent acute flares.      Breath shortness    Acute sinusitis      Allergies  Allergen Reactions  . Amoxicillin Diarrhea  . Flomax [Tamsulosin Hcl] Palpitations  . Flomax [Tamsulosin Hcl] Palpitations  . Tamsulosin Palpitations    Immunization History  Administered Date(s) Administered  . Pneumococcal Polysaccharide-23 01/04/2016  . Tdap 06/04/2019    Past Medical History:  Diagnosis Date  . Anxiety    generalized anxiety disorder  . Asthma   . Coronary artery disease   . Depression   . Eczema of scalp   . Enlarged prostate   . Family history of anesthesia complication    Father vomits  . GERD (gastroesophageal reflux disease)   . Hyperlipemia   . Prostatitis     Tobacco History: Social History   Tobacco Use  Smoking Status Never Smoker  Smokeless Tobacco Never Used   Counseling given: Not Answered   Continue to not smoke  Outpatient Encounter Medications as of 08/19/2020  Medication Sig  .  albuterol (VENTOLIN HFA) 108 (90 Base) MCG/ACT inhaler SMARTSIG:2 Puff(s) By Mouth 4 Times Daily  . amLODipine (NORVASC) 2.5 MG tablet Take by mouth.  Marland Kitchen amphetamine-dextroamphetamine (ADDERALL) 5 MG tablet Take 5 mg by mouth as needed.   Marland Kitchen aspirin 81 MG tablet Take 81 mg by mouth daily.  . Carbinoxamine Maleate 4 MG TABS Take 4 mg every 6-8 hours as needed  . Cholecalciferol (VITAMIN D) 2000 units tablet Take by mouth.  . clonazePAM (KLONOPIN) 1 MG tablet Take 1 mg by mouth 2 (two) times daily as needed for anxiety.  . Cyanocobalamin (VITAMIN B-12 CR) 1000 MCG TBCR Take as directed.  Marland Kitchen esomeprazole (NEXIUM) 40 MG capsule Take 1 capsule (40 mg total) by mouth daily.  . fexofenadine (ALLEGRA) 60 MG tablet Take 60 mg by mouth 2 (two) times daily.  . Fluticasone Propionate (XHANCE) 93 MCG/ACT EXHU Place 2 application into the nose in the morning and at bedtime.  . Fluticasone-Umeclidin-Vilant (TRELEGY ELLIPTA) 200-62.5-25 MCG/INH AEPB Inhale 1 puff into the lungs daily.  . Ginkgo Biloba Extract (GINKGO BILOBA MEMORY  ENHANCER) 60 MG CAPS Take 1 tablet by mouth 2 (two) times daily.  Marland Kitchen ibuprofen (ADVIL,MOTRIN) 600 MG tablet Take 1 tablet (600 mg total) by mouth every 6 (six) hours as needed for pain.  Marland Kitchen lamoTRIgine (LAMICTAL) 25 MG tablet 125 mg at bedtime.  . levalbuterol (XOPENEX HFA) 45 MCG/ACT inhaler Inhale 2 puffs into the lungs every 6 (six) hours as needed for wheezing.  . mometasone (NASONEX) 50 MCG/ACT nasal spray ONE SPRAY EACH NOSTRIL 1-2 TIMES  DAY AS NEEDED FOR NASAL CONGESTION OR DRAINAGE.  . Multiple Vitamin (MULTI-VITAMINS) TABS Frequency:daily   Dosage:0.0     Instructions:Multivitamin ( TABS, 1 Oral daily)  Note:  . Olopatadine HCl (PATADAY) 0.2 % SOLN as needed.  . rosuvastatin (CRESTOR) 10 MG tablet Take by mouth.  . tadalafil (CIALIS) 5 MG tablet Take by mouth.  . Testosterone 30 MG/ACT SOLN Apply 20 mg topically.  . vitamin C (ASCORBIC ACID) 500 MG tablet 500 mg.  . zolpidem (AMBIEN CR) 12.5 MG CR tablet Take by mouth as needed.  Marland Kitchen olmesartan (BENICAR) 40 MG tablet Take by mouth.   Facility-Administered Encounter Medications as of 08/19/2020  Medication  . predniSONE (DELTASONE) tablet 10 mg   Review of systems:  Intermittent palpations. Comprehensive review of systems otherwise negative.    Physical Exam  BP 126/74 (BP Location: Left Arm, Cuff Size: Normal)   Pulse 85   Temp 98.2 F (36.8 C) (Oral)   Ht 5\' 9"  (1.753 m)   Wt 155 lb 6.4 oz (70.5 kg)   SpO2 98%   BMI 22.95 kg/m   Wt Readings from Last 5 Encounters:  08/19/20 155 lb 6.4 oz (70.5 kg)  07/03/20 151 lb 12.8 oz (68.9 kg)  05/21/20 153 lb 3.5 oz (69.5 kg)  12/29/15 163 lb 3.2 oz (74 kg)  08/15/14 167 lb (75.8 kg)    BMI Readings from Last 5 Encounters:  08/19/20 22.95 kg/m  07/03/20 22.42 kg/m  05/21/20 22.80 kg/m  12/29/15 23.96 kg/m  08/15/14 24.66 kg/m     Physical Exam General: Well-appearing, fit, in no acute distress Eyes: EOMI, no icterus Respiratory: NWOB, on RA Neuro: Normal gait,  no weakness, Psych: Normal mood, full affect   Assessment & Plan:   Mr. Harewood is a 54 y.o. male with past medical history of sinusitis and allergic asthma we are in follow up for cough, dyspnea on exertion.  DOE:  Symptoms largely improved with initiation of ICS/LABA/LAMA via Trelegy. Suspect reactive airway and recrudescence of asthma after exposure under house in June 2021. Prior spirometry with significant bronchodilator response 05/21/20.  Still some intermittent chest tightness, sensation can no get fiull breath. Work up has included normal chest CT and negative ABPA panel, normal IgE, no elevation of eosinophils. No further escalation of asthma therapy available at this time. Encouraged he follow up w/ cardiologist for possible stress test/TTE given CAD. Possible valvular abnormality or diastolic dysfunction worse with exertion leading to dynamic pulmonary edema. However, suspect residual symptoms are related to asthma or distress related to the event and may well continue improve with time.  BP is good in clinic today. On increased benicar dose and small dose amlodipine per PCP.   Return in about 4 months (around 12/19/2020). If Symptoms stable consider stepdown therapy.   This visit required 30 minutes of time including face to face care, review of records, tests, and after visit coordination of care.   Karren Burly, MD 08/19/2020

## 2020-09-18 ENCOUNTER — Telehealth: Payer: Self-pay

## 2020-09-18 NOTE — Telephone Encounter (Signed)
Pa submitted thru cover my meds for xhance 

## 2020-09-21 NOTE — Telephone Encounter (Signed)
[facility]ia [facility]ouch has denied xhance

## 2020-10-28 ENCOUNTER — Telehealth: Payer: Self-pay | Admitting: Allergy and Immunology

## 2020-10-28 NOTE — Telephone Encounter (Signed)
Renae from [facility] wants to know if denial will be appealed.

## 2020-10-29 NOTE — Telephone Encounter (Signed)
Spoke to [facility] they will send over the appeal letter with instructions to fax to plan

## 2021-01-27 ENCOUNTER — Telehealth: Payer: Self-pay | Admitting: Allergy

## 2021-01-27 NOTE — Telephone Encounter (Signed)
[facility] requesting a pre authorization for patients RX Timmothy Sours) [phone] please advise

## 2021-01-28 NOTE — Telephone Encounter (Signed)
Pa submitted waiting on result

## 2021-01-28 NOTE — Telephone Encounter (Signed)
Will attempt pa

## 2021-04-05 ENCOUNTER — Other Ambulatory Visit: Payer: Self-pay

## 2021-04-12 ENCOUNTER — Other Ambulatory Visit: Payer: Self-pay

## 2021-04-12 MED ORDER — ESOMEPRAZOLE MAGNESIUM 40 MG PO CPDR: DELAYED_RELEASE_CAPSULE | ORAL | 0 refills | Status: AC

## 2021-05-05 ENCOUNTER — Telehealth: Payer: 59

## 2021-05-05 NOTE — Telephone Encounter (Signed)
Pt needs OV appt, LOV was 7/21.

## 2021-07-30 ENCOUNTER — Other Ambulatory Visit: Payer: Self-pay

## 2021-07-30 NOTE — Telephone Encounter (Signed)
Denied esomeprazole mag dr 40 mg cap (nexium) pt. Needs an ov. Hasn't been seen since 07/21 [facility]ed to [facility]. We have a refill request of same medication from [facility], which I filled out and faxed back to [address] At [phone]

## 2022-04-20 IMAGING — CT CT CHEST W/O CM
2 of 3 series · 15 of 36 positions shown, 18 images · non-contrast
Comparison: Radiograph 06/19/2020, CT 06/18/2014

CLINICAL DATA: Follow-up abnormal chest radiograph

EXAM:
CT CHEST WITHOUT CONTRAST
TECHNIQUE: Multidetector CT imaging of the chest was performed following the
standard protocol without IV contrast.

[Series 2: thorax · axial · 0.78mm/px · z∈[-338,-62]mm · 12 of 164 slices shown, 15 images]
[im 13/164  mediastinal]
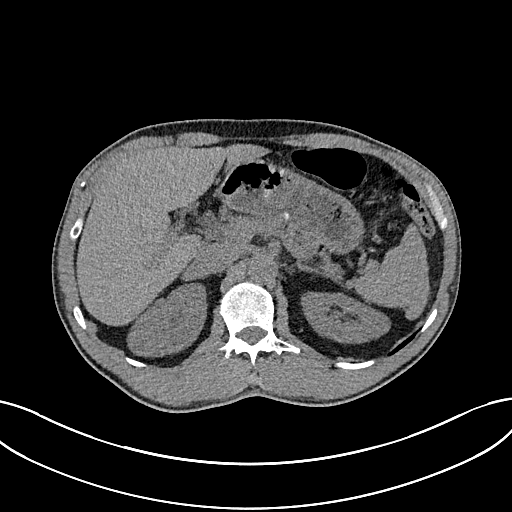
[im 13/164  lung]
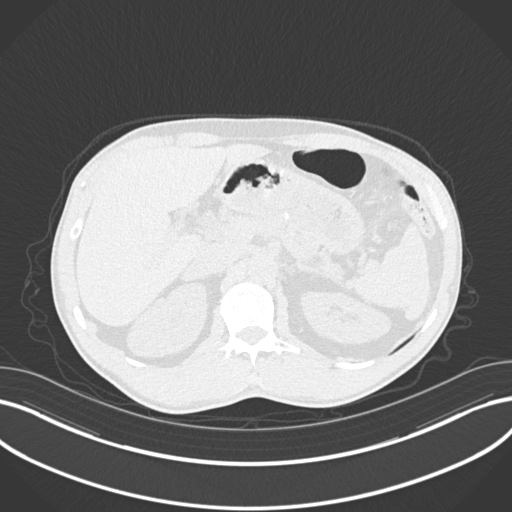
[im 25/164  lung]
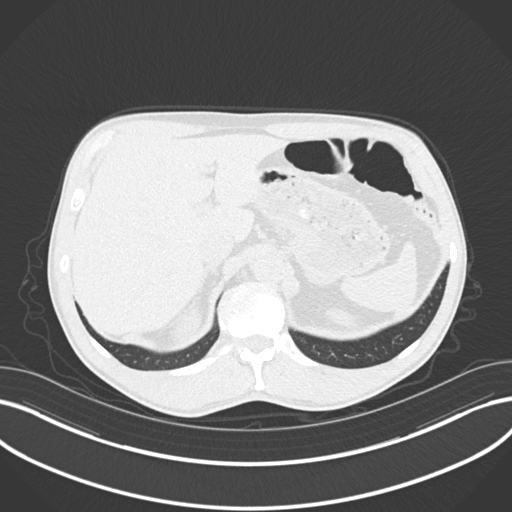
[im 37/164  lung]
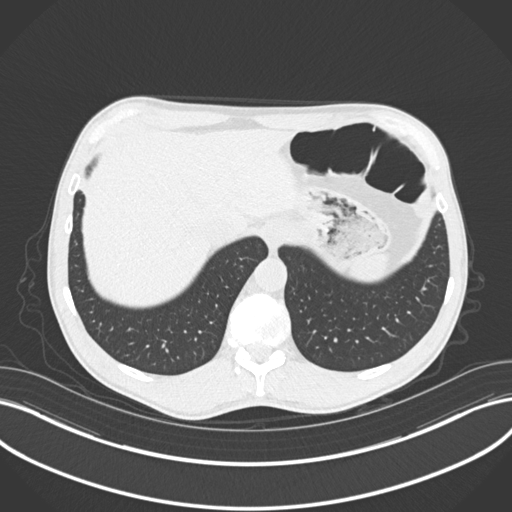
[im 49/164  lung]
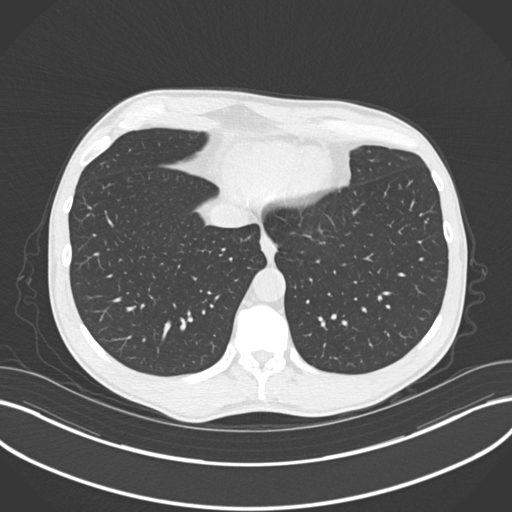
[im 61/164  mediastinal]
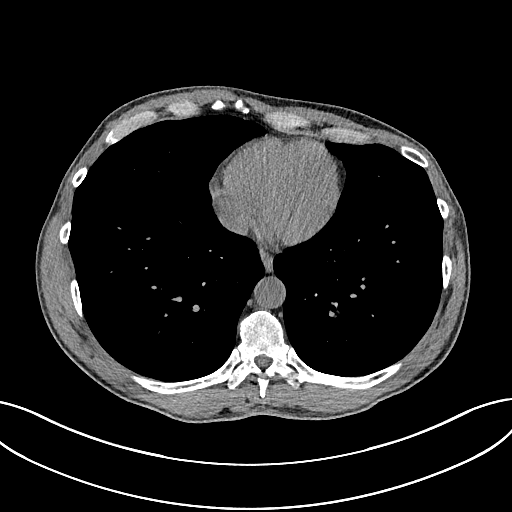
[im 61/164  lung]
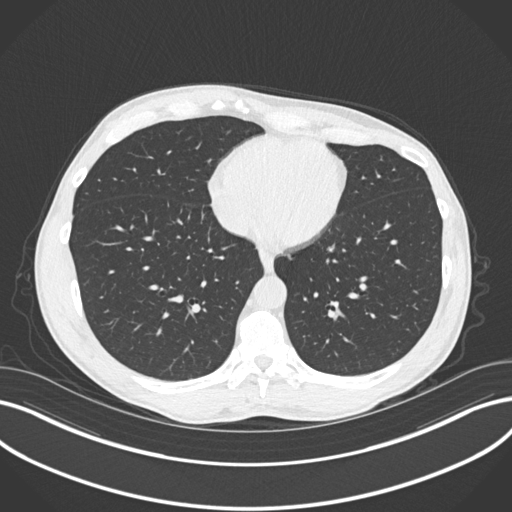
[im 73/164  lung]
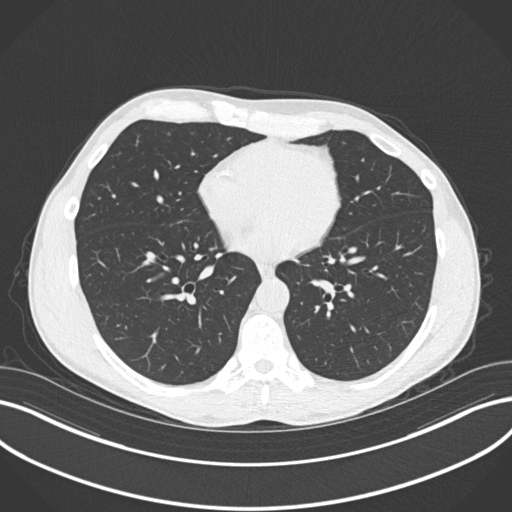
[im 91/164  lung]
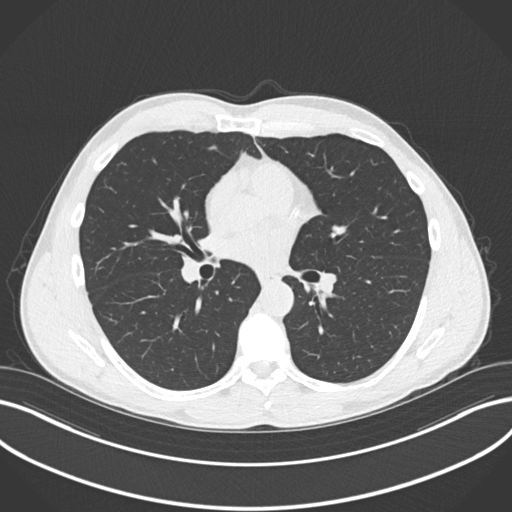
[im 103/164  lung]
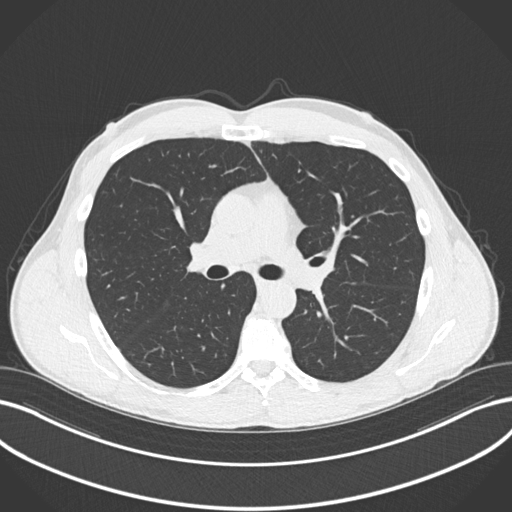
[im 115/164  mediastinal]
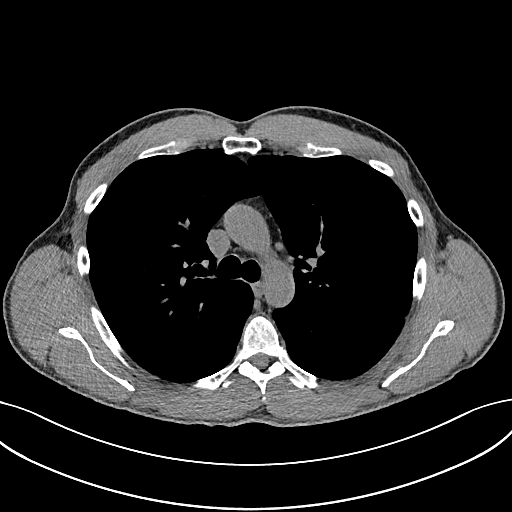
[im 115/164  lung]
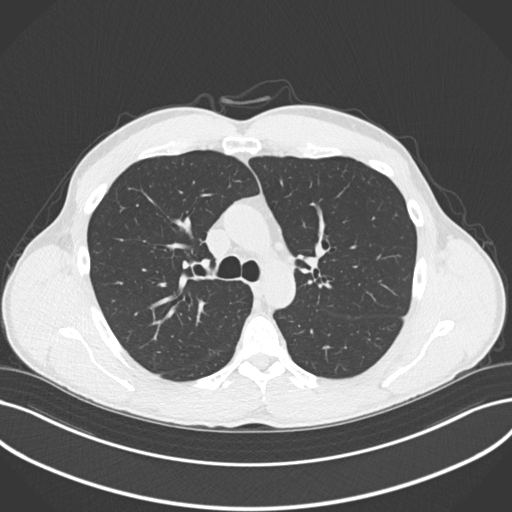
[im 127/164  lung]
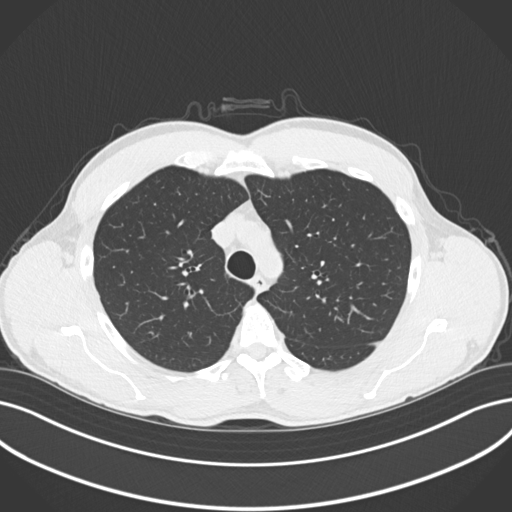
[im 139/164  lung]
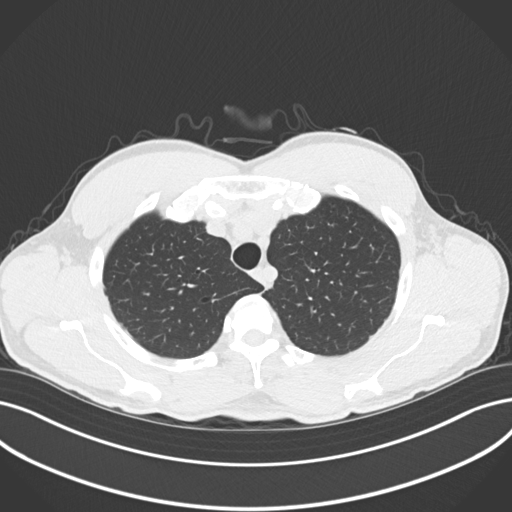
[im 151/164  lung]
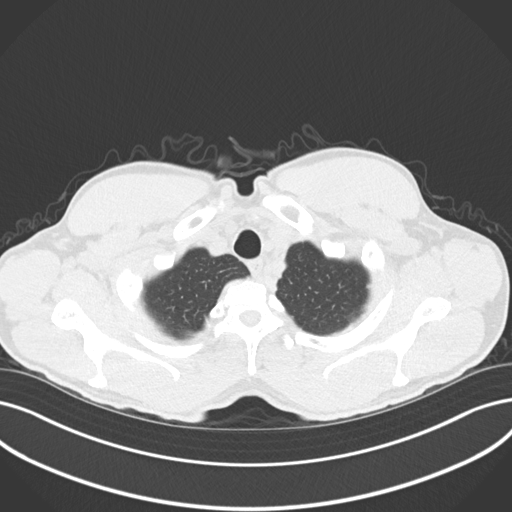

[Series 5: coronal · coronal · 0.69mm/px · 3 of 131 slices shown]
[im 27/131  lung]
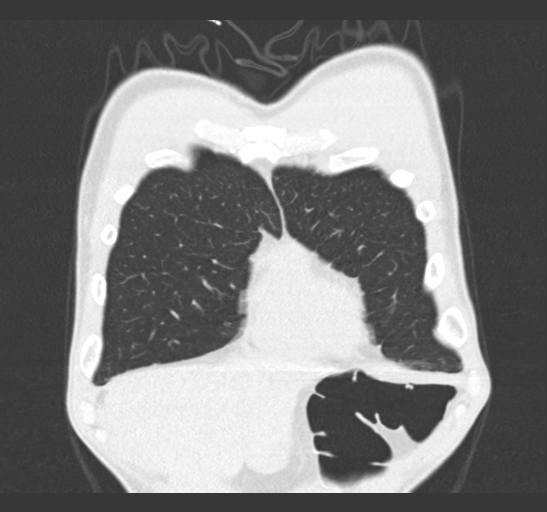
[im 53/131  lung]
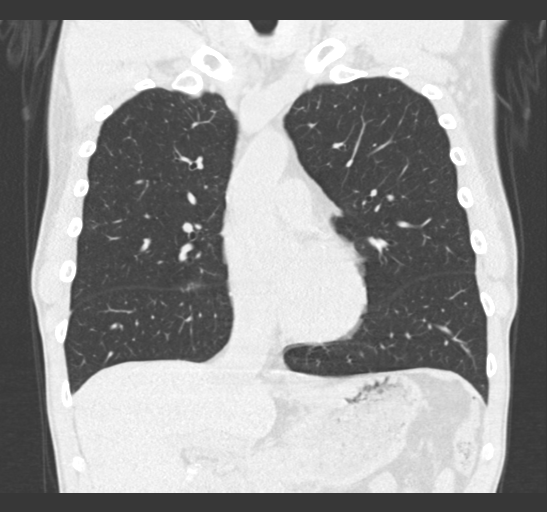
[im 79/131  lung]
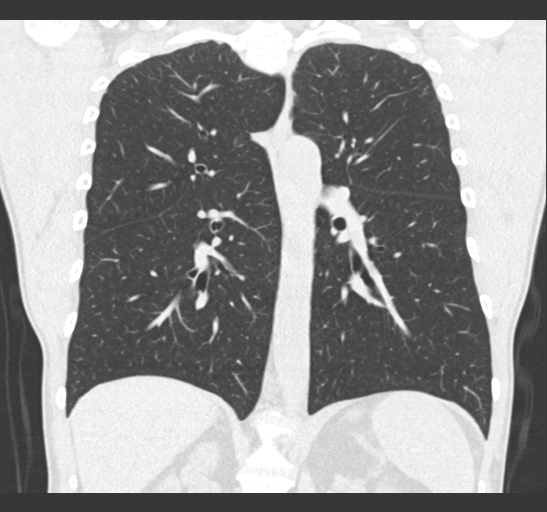

[15 of 36 positions shown; findings below may reference images not displayed]

FINDINGS: Cardiovascular: Normal heart size. No pericardial effusion. Coronary
artery calcifications are present. Minimal atherosclerotic plaque in
the normal caliber thoracic aorta. No periaortic stranding or
hemorrhage. Normal branching of the proximal great vessels. Central
pulmonary arteries are normal caliber. No major venous abnormalities

Mediastinum/Nodes: No mediastinal fluid or gas. Normal thyroid gland
and thoracic inlet. No acute abnormality of the trachea or
esophagus. No worrisome mediastinal, hilar or axillary adenopathy.

Lungs/Pleura: Few scattered punctate calcified granulomata. No
suspicious pulmonary nodules or masses. No consolidation, features
of edema, pneumothorax, or effusion.

Upper Abdomen: Calcified gallstones within an otherwise normal
gallbladder. No pericholecystic fluid or inflammation. No acute
abnormalities present in the visualized portions of the upper
abdomen.

Musculoskeletal: Multilevel degenerative changes are present in the
imaged portions of the spine. No acute osseous abnormality or
suspicious osseous lesion. No worrisome chest wall lesions.
IMPRESSION: 1. No acute intrathoracic process. Airways thickening seen on
comparison radiograph not as well appreciated on this CT, likely
reflecting resolution of the acute process.
2. Few scattered punctate calcified granulomata in the lungs, likely
sequela of prior granulomatous disease.
3. No suspicious pulmonary nodules or masses.
4. Cholelithiasis without evidence of acute cholecystitis.
5. Coronary artery atherosclerosis.
6. Aortic Atherosclerosis ([id number]).

## 2023-10-02 ENCOUNTER — Encounter: Payer: Self-pay | Admitting: Behavioral Health

## 2023-10-02 ENCOUNTER — Ambulatory Visit (INDEPENDENT_AMBULATORY_CARE_PROVIDER_SITE_OTHER): Payer: BC Managed Care – PPO | Admitting: Behavioral Health

## 2023-10-02 VITALS — BP 164/99 | HR 75 | Ht 69.0 in | Wt 155.0 lb

## 2023-10-02 DIAGNOSIS — F5105 Insomnia due to other mental disorder: Secondary | ICD-10-CM

## 2023-10-02 DIAGNOSIS — F411 Generalized anxiety disorder: Secondary | ICD-10-CM | POA: Diagnosis not present

## 2023-10-02 DIAGNOSIS — F99 Mental disorder, not otherwise specified: Secondary | ICD-10-CM

## 2023-10-02 DIAGNOSIS — F900 Attention-deficit hyperactivity disorder, predominantly inattentive type: Secondary | ICD-10-CM | POA: Diagnosis not present

## 2023-10-02 MED ORDER — LAMOTRIGINE 100 MG PO TABS: 100.0000 mg | ORAL_TABLET | Freq: Every day | ORAL | 1 refills | Status: DC

## 2023-10-02 MED ORDER — AMPHETAMINE-DEXTROAMPHETAMINE 10 MG PO TABS: 10.0000 mg | ORAL_TABLET | Freq: Two times a day (BID) | ORAL | 0 refills | Status: DC

## 2023-10-02 MED ORDER — CLONAZEPAM 1 MG PO TABS: 1.0000 mg | ORAL_TABLET | Freq: Two times a day (BID) | ORAL | 1 refills | Status: DC | PRN

## 2023-10-02 MED ORDER — ZOLPIDEM TARTRATE ER 12.5 MG PO TBCR: 12.5000 mg | EXTENDED_RELEASE_TABLET | Freq: Every day | ORAL | 1 refills | Status: DC

## 2023-10-02 NOTE — Progress Notes (Addendum)
[facility] MD/PA/NP Initial Note  10/02/2023 4:41 PM Ryan Baker  MRN:  [id number]  Chief Complaint:  Chief Complaint   Anxiety; ADHD; Follow-up; Medication Refill; Patient Education     HPI: "Tim", 57 year old male presents to this office for initial visit and to establish care.  Collateral information should be considered reliable.  He is pleasant, talkative, and cooperative. Patient states that he is a former patient of Dr. Meredith Staggers approximately 15 years ago.  His PCP has been maintaining some of his psychiatric medications over the years.  Says that he is here today to have psychiatric provider manage his psychotropic medications.  Says that he struggled with depression and anxiety many years ago that things have improved as he is aged.  Today he does not want to make any changes to his medications.  His pH Q2 was negative.  His MDQ was negative.  He rates anxiety today at 0/10, and depression at 0/10.  He needs refills on his medication.  Says that he has continued his Lamictal over the years because he does feel better on it.  When he has tried to reduce the medication he notices a difference.  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  He is married with 2 grown children.  Says that he has tried many medications for depression and anxiety many years ago.  He denies any history of mania, no psychosis, no auditory or visual hallucinations.  Denies any history of SI or HI.  Past psychiatric medication trials:  Celexa Zoloft Viibryd Prozac Effexor Lamotrigine Clonazepam Lexapro Wellbutrin Pristiq Cymbalta Amitriptyline Trintellix Seroquel Propranolol BuSpar Gabapentin Trazodone Lunesta Ambien Xanax Ativan   Visit Diagnosis:    ICD-10-CM   1. Attention deficit hyperactivity disorder (ADHD), predominantly inattentive type  F90.0 amphetamine-dextroamphetamine (ADDERALL) 10 MG tablet    2. Generalized anxiety disorder  F41.1  clonazePAM (KLONOPIN) 1 MG tablet    3. Insomnia due to other mental disorder  F51.05 clonazePAM (KLONOPIN) 1 MG tablet   F99 zolpidem (AMBIEN CR) 12.5 MG CR tablet      Past Psychiatric History: MDD, Anxiety, ADHD  Past Medical History:  Past Medical History:  Diagnosis Date   Anxiety    generalized anxiety disorder   Asthma    Coronary artery disease    Depression    Eczema of scalp    Enlarged prostate    Family history of anesthesia complication    Father vomits   GERD (gastroesophageal reflux disease)    Hyperlipemia    Prostatitis     Past Surgical History:  Procedure Laterality Date   CORONARY ANGIOPLASTY     sept 2013   CORONARY ANGIOPLASTY WITH STENT PLACEMENT     ESOPHAGOGASTRODUODENOSCOPY     SHOULDER ARTHROSCOPY Left 08/15/2014   Procedure: LEFT ARTHROSCOPY SHOULDER WITH ACROMIO CLAVICULAR RESECTION;  Surgeon: Verlee Rossetti, MD;  Location: [facility] OR;  Service: Orthopedics;  Laterality: Left;   VASECTOMY     WISDOM TOOTH EXTRACTION      Family Psychiatric History: see chart  Family History:  Family History  Problem Relation Age of Onset   Anxiety disorder Mother    Hypertension Father    Allergic rhinitis Sister    Hypertension Other    Heart disease Other    Angioedema Neg Hx    Asthma Neg Hx    Eczema Neg Hx    Immunodeficiency Neg Hx    Urticaria Neg Hx     Social  History:  Social History   Socioeconomic History   Marital status: Married    Spouse name: Dawn   Number of children: 2   Years of education: 19   Highest education level: Manufacturing engineer (e.g., MA, MS, MEng, MEd, MSW, MBA)  Occupational History   Occupation: Licensed Theatre manager  Tobacco Use   Smoking status: Never   Smokeless tobacco: Never  Vaping Use   Vaping status: Never Used  Substance and Sexual Activity   Alcohol use: No    Comment: social- rare   Drug use: No   Sexual activity: Yes  Other Topics Concern   Not on file  Social History Narrative    Lives in [place] with wife.  Enjoys pickle ball, swim, snow ski in free time.    Social Determinants of Health   Financial Resource Strain: Patient Declined (02/13/2023)   Received from [facility]   Overall Financial Resource Strain (CARDIA)    Difficulty of Paying Living Expenses: Patient declined  Food Insecurity: Low Risk  (06/19/2023)   Received from [facility]   Hunger Vital Sign    Worried About Running Out of Food in the Last Year: Never true    Ran Out of Food in the Last Year: Never true  Transportation Needs: Not on file (06/19/2023)  Physical Activity: Sufficiently Active (02/13/2023)   Received from [facility]   Exercise Vital Sign    Days of Exercise per Week: 6 days    Minutes of Exercise per Session: 60 min  Stress: Patient Declined (02/13/2023)   Received from [facility] Occup[facility] - Occupational Stress Questionnaire    Feeling of Stress : Patient declined  Social Connections: Socially Integrated (02/13/2023)   Received from [facility]   Social Network    How would you rate your social network (family, work, friends)?: Good participation with social networks    Allergies:  Allergies  Allergen Reactions   Amoxicillin Diarrhea   Flomax [Tamsulosin Hcl] Palpitations   Flomax [Tamsulosin Hcl] Palpitations   Tamsulosin Palpitations    Metabolic Disorder Labs: No results found for: "HGBA1C", "MPG" No results found for: "PROLACTIN" No results found for: "CHOL", "TRIG", "HDL", "CHOLHDL", "VLDL", "LDLCALC" No results found for: "TSH"  Therapeutic Level Labs: No results found for: "LITHIUM" No results found for: "VALPROATE" No results found for: "CBMZ"  Current Medications: Current Outpatient Medications  Medication Sig Dispense Refill   amphetamine-dextroamphetamine (ADDERALL) 10 MG tablet Take 1 tablet (10 mg total) by mouth 2 (two) times daily with a meal. 60 tablet 0   lamoTRIgine (LAMICTAL) 100 MG tablet  Take 1 tablet (100 mg total) by mouth daily. 90 tablet 1   albuterol (VENTOLIN HFA) 108 (90 Base) MCG/ACT inhaler SMARTSIG:2 Puff(s) By Mouth 4 Times Daily     amLODipine (NORVASC) 2.5 MG tablet Take by mouth.     amphetamine-dextroamphetamine (ADDERALL) 5 MG tablet Take 5 mg by mouth as needed.      aspirin 81 MG tablet Take 81 mg by mouth daily.     Carbinoxamine Maleate 4 MG TABS Take 4 mg every 6-8 hours as needed 28 tablet 5   Cholecalciferol (VITAMIN D) 2000 units tablet Take by mouth.     clonazePAM (KLONOPIN) 1 MG tablet Take 1 tablet (1 mg total) by mouth 2 (two) times daily as needed for anxiety. 60 tablet 1   Cyanocobalamin (VITAMIN B-12 CR) 1000 MCG TBCR Take as directed.  esomeprazole (NEXIUM) 40 MG capsule Take 1 capsule daily for reflux. 30 capsule 0   fexofenadine (ALLEGRA) 60 MG tablet Take 60 mg by mouth 2 (two) times daily.     Fluticasone Propionate (XHANCE) 93 MCG/ACT EXHU Place 2 application into the nose in the morning and at bedtime. 16 mL 5   Fluticasone-Umeclidin-Vilant (TRELEGY ELLIPTA) 200-62.5-25 MCG/INH AEPB Inhale 1 puff into the lungs daily. 60 each 12   Ginkgo Biloba Extract (GINKGO BILOBA MEMORY ENHANCER) 60 MG CAPS Take 1 tablet by mouth 2 (two) times daily.     ibuprofen (ADVIL,MOTRIN) 600 MG tablet Take 1 tablet (600 mg total) by mouth every 6 (six) hours as needed for pain. 30 tablet 0   lamoTRIgine (LAMICTAL) 25 MG tablet 125 mg at bedtime.     levalbuterol (XOPENEX HFA) 45 MCG/ACT inhaler Inhale 2 puffs into the lungs every 6 (six) hours as needed for wheezing. 1 Inhaler 2   mometasone (NASONEX) 50 MCG/ACT nasal spray ONE SPRAY EACH NOSTRIL 1-2 TIMES  DAY AS NEEDED FOR NASAL CONGESTION OR DRAINAGE. 17 g 0   Multiple Vitamin (MULTI-VITAMINS) TABS Frequency:daily   Dosage:0.0     Instructions:Multivitamin ( TABS, 1 Oral daily)  Note:     olmesartan (BENICAR) 40 MG tablet Take by mouth.     Olopatadine HCl (PATADAY) 0.2 % SOLN as needed.     rosuvastatin  (CRESTOR) 10 MG tablet Take by mouth.     tadalafil (CIALIS) 5 MG tablet Take by mouth.     Testosterone 30 MG/ACT SOLN Apply 20 mg topically.     vitamin C (ASCORBIC ACID) 500 MG tablet 500 mg.     zolpidem (AMBIEN CR) 12.5 MG CR tablet Take 1 tablet (12.5 mg total) by mouth at bedtime. 30 tablet 1   Current Facility-Administered Medications  Medication Dose Route Frequency Provider Last Rate Last Admin   predniSONE (DELTASONE) tablet 10 mg  10 mg Oral UD Bobbitt, Heywood Iles, MD        Medication Side Effects: none  Orders placed this visit:  No orders of the defined types were placed in this encounter.   Psychiatric Specialty Exam:  Review of Systems  Constitutional: Negative.   Allergic/Immunologic: Negative.   Psychiatric/Behavioral:  Positive for decreased concentration and sleep disturbance.     Blood pressure (!) 164/99, pulse 75, height 5\' 9"  (1.753 m), weight 155 lb (70.3 kg).Body mass index is 22.89 kg/m.  General Appearance: Casual, Neat, and Well Groomed  Eye Contact:  Good  Speech:  Clear and Coherent  Volume:  Normal  Mood:  NA and Anxious  Affect:  Appropriate  Thought Process:  Coherent  Orientation:  Full (Time, Place, and Person)  Thought Content: Logical   Suicidal Thoughts:  No  Homicidal Thoughts:  No  Memory:  WNL  Judgement:  Good  Insight:  Good  Psychomotor Activity:  Normal  Concentration:  Concentration: Good  Recall:  Good  Fund of Knowledge: Good  Language: Good  Assets:  Desire for Improvement  ADL's:  Intact  Cognition: WNL  Prognosis:  Good   Screenings:  PHQ2-9    Flowsheet Row Office Visit from 10/02/2023 in [facility]  PHQ-2 Total Score 0       Receiving Psychotherapy: No   Treatment Plan/Recommendations:   Greater than 50% of 60 min  face to face time with patient was spent on counseling and coordination of care. We discussed his past history of anxiety, depression, and ADHD  stemming back  15 or 16 years ago.  We discussed his many different medication trials.  There are probably some medications that he did not allow enough time to see if medications would work.  However over the years symptoms have digressed.  We talked about his current plan of care with his PCP along with current medication regimen.  His PCP was more comfortable and referring him to psychiatric provider to consider increase of his ADHD medication.  For now he would like to keep his current medication regimen the same.  Refills needed.  Today we agreed: To continue Adderall 10 mg twice daily as needed Will continue Klonopin 0.5 mg twice daily as needed To continue Lamictal 100 mg at bedtime daily To continue Ambien 12.5 mg CR daily at bedtime as needed Will report worsening symptoms or side effects promptly To follow-up in 2 months to reassess Provided emergency contact information number Monitor for any sign of rash. Please taking Lamictal and contact office immediately rash develops. Recommend seeking urgent medical attention if rash is severe and/or spreading quickly.  Discussed potential benefits, risks, and side effects of stimulants with patient to include increased heart rate, palpitations, insomnia, increased anxiety, increased irritability, or decreased appetite.  Instructed patient to contact office if experiencing any significant tolerability issues.  Reviewed PDMP   Joan Flores, NP

## 2023-11-06 ENCOUNTER — Other Ambulatory Visit: Payer: Self-pay | Admitting: Behavioral Health

## 2023-11-06 DIAGNOSIS — F900 Attention-deficit hyperactivity disorder, predominantly inattentive type: Secondary | ICD-10-CM

## 2023-12-05 ENCOUNTER — Ambulatory Visit: Payer: BC Managed Care – PPO | Admitting: Psychiatry

## 2023-12-05 ENCOUNTER — Encounter: Payer: Self-pay | Admitting: Psychiatry

## 2023-12-05 DIAGNOSIS — F900 Attention-deficit hyperactivity disorder, predominantly inattentive type: Secondary | ICD-10-CM

## 2023-12-05 DIAGNOSIS — F99 Mental disorder, not otherwise specified: Secondary | ICD-10-CM | POA: Diagnosis not present

## 2023-12-05 DIAGNOSIS — F5105 Insomnia due to other mental disorder: Secondary | ICD-10-CM

## 2023-12-05 DIAGNOSIS — F411 Generalized anxiety disorder: Secondary | ICD-10-CM | POA: Diagnosis not present

## 2023-12-05 MED ORDER — ZOLPIDEM TARTRATE ER 12.5 MG PO TBCR: 12.5000 mg | EXTENDED_RELEASE_TABLET | Freq: Every day | ORAL | 1 refills | Status: DC

## 2023-12-05 MED ORDER — AMPHETAMINE-DEXTROAMPHETAMINE 10 MG PO TABS: 10.0000 mg | ORAL_TABLET | Freq: Two times a day (BID) | ORAL | 0 refills | Status: DC

## 2023-12-05 MED ORDER — CLONAZEPAM 1 MG PO TABS: 1.0000 mg | ORAL_TABLET | Freq: Two times a day (BID) | ORAL | 2 refills | Status: DC | PRN

## 2023-12-05 MED ORDER — LAMOTRIGINE 100 MG PO TABS: 100.0000 mg | ORAL_TABLET | Freq: Every day | ORAL | 1 refills | Status: DC

## 2023-12-05 NOTE — Progress Notes (Signed)
 Ryan Baker [id number] 1966/07/07 58 y.o.  Subjective:   Patient ID:  Ryan Baker is a 58 y.o. (DOB 18-Mar-1966) male.  Chief Complaint:  Chief Complaint  Patient presents with   Follow-up    HPI Ryan Baker presents to the office today for follow-up of ADD, sleep and anxiety.  Psych med:  Adderall 10 mg BID, clonazepam  1 mg BID prn, usually 1 mg nightly, lamotrigine  125 down to 100 mg daily. Ambien  prn insomnia.   Better sleep on weekends without Adderall.   No there SE. Sleep pretty good usually.  Adderall is effective.  Patient reports stable mood and denies depressed or irritable moods.  Patient denies any recent difficulty with anxiety.  Patient denies difficulty with sleep initiation or maintenance. Denies appetite disturbance.  Patient reports that energy and motivation have been good.  Patient denies any difficulty with concentration.  Patient denies any suicidal ideation.  Stressor son Chauncey with academic px, social anxiety, ? ADD, poor school performance.   PHQ2-9    Flowsheet Row Office Visit from 10/02/2023 in [facility]  PHQ-2 Total Score 0        Review of Systems:  Review of Systems  Cardiovascular:  Negative for palpitations.  Neurological:  Negative for tremors and weakness.    Medications: I have reviewed the patient's current medications.  Current Outpatient Medications  Medication Sig Dispense Refill   albuterol  (VENTOLIN  HFA) 108 (90 Base) MCG/ACT inhaler SMARTSIG:2 Puff(s) By Mouth 4 Times Daily     amLODipine (NORVASC) 2.5 MG tablet Take by mouth.     aspirin 81 MG tablet Take 81 mg by mouth daily.     Carbinoxamine  Maleate 4 MG TABS Take 4 mg every 6-8 hours as needed 28 tablet 5   Cholecalciferol (VITAMIN D) 2000 units tablet Take by mouth.     Cyanocobalamin (VITAMIN B-12 CR) 1000 MCG TBCR Take as directed.     esomeprazole  (NEXIUM ) 40 MG capsule Take 1 capsule daily for reflux. 30 capsule 0    fexofenadine (ALLEGRA) 60 MG tablet Take 60 mg by mouth 2 (two) times daily.     Fluticasone Propionate  (XHANCE ) 93 MCG/ACT EXHU Place 2 application into the nose in the morning and at bedtime. 16 mL 5   Fluticasone-Umeclidin-Vilant (TRELEGY ELLIPTA ) 200-62.5-25 MCG/INH AEPB Inhale 1 puff into the lungs daily. 60 each 12   Ginkgo Biloba Extract (GINKGO BILOBA MEMORY ENHANCER) 60 MG CAPS Take 1 tablet by mouth 2 (two) times daily.     ibuprofen  (ADVIL ,MOTRIN ) 600 MG tablet Take 1 tablet (600 mg total) by mouth every 6 (six) hours as needed for pain. 30 tablet 0   levalbuterol  (XOPENEX  HFA) 45 MCG/ACT inhaler Inhale 2 puffs into the lungs every 6 (six) hours as needed for wheezing. 1 Inhaler 2   mometasone  (NASONEX ) 50 MCG/ACT nasal spray ONE SPRAY EACH NOSTRIL 1-2 TIMES  DAY AS NEEDED FOR NASAL CONGESTION OR DRAINAGE. 17 g 0   Multiple Vitamin (MULTI-VITAMINS) TABS Frequency:daily   Dosage:0.0     Instructions:Multivitamin ( TABS, 1 Oral daily)  Note:     Olopatadine HCl (PATADAY) 0.2 % SOLN as needed.     rosuvastatin (CRESTOR) 10 MG tablet Take by mouth.     tadalafil  (CIALIS ) 5 MG tablet Take by mouth.     Testosterone  30 MG/ACT SOLN Apply 20 mg topically.     vitamin C (ASCORBIC ACID) 500 MG tablet 500 mg.     amphetamine -dextroamphetamine  (ADDERALL) 10 MG  tablet Take 1 tablet (10 mg total) by mouth 2 (two) times daily with a meal. 180 tablet 0   clonazePAM  (KLONOPIN ) 1 MG tablet Take 1 tablet (1 mg total) by mouth 2 (two) times daily as needed for anxiety. 60 tablet 2   lamoTRIgine  (LAMICTAL ) 100 MG tablet Take 1 tablet (100 mg total) by mouth daily. 90 tablet 1   olmesartan (BENICAR) 40 MG tablet Take by mouth.     zolpidem  (AMBIEN  CR) 12.5 MG CR tablet Take 1 tablet (12.5 mg total) by mouth at bedtime. 30 tablet 1   Current Facility-Administered Medications  Medication Dose Route Frequency Provider Last Rate Last Admin   predniSONE  (DELTASONE ) tablet 10 mg  10 mg Oral UD Bobbitt, Elgin Pepper, MD        Medication Side Effects: None  Allergies:  Allergies  Allergen Reactions   Amoxicillin Diarrhea   Flomax [Tamsulosin Hcl] Palpitations   Flomax [Tamsulosin Hcl] Palpitations   Tamsulosin Palpitations    Past Medical History:  Diagnosis Date   Anxiety    generalized anxiety disorder   Asthma    Coronary artery disease    Depression    Eczema of scalp    Enlarged prostate    Family history of anesthesia complication    Father vomits   GERD (gastroesophageal reflux disease)    Hyperlipemia    Prostatitis     Past Medical History, Surgical history, Social history, and Family history were reviewed and updated as appropriate.   Please see review of systems for further details on the patient's review from today.   Objective:   Physical Exam:  There were no vitals taken for this visit.  Physical Exam Constitutional:      General: He is not in acute distress.    Appearance: He is well-developed.  Musculoskeletal:        General: No deformity.  Neurological:     Mental Status: He is alert and oriented to person, place, and time.     Coordination: Coordination normal.  Psychiatric:        Attention and Perception: Attention and perception normal.        Mood and Affect: Mood is not anxious or depressed. Affect is not labile, blunt, angry or inappropriate.        Speech: Speech normal.        Behavior: Behavior normal.        Thought Content: Thought content normal. Thought content is not delusional. Thought content does not include homicidal or suicidal ideation. Thought content does not include suicidal plan.        Cognition and Memory: Cognition and memory normal.        Judgment: Judgment normal.     Lab Review:     Component Value Date/Time   NA 137 08/14/2014 1511   K 3.8 08/14/2014 1511   CL 100 08/14/2014 1511   CO2 26 08/14/2014 1511   GLUCOSE 108 (H) 08/14/2014 1511   BUN 10 08/14/2014 1511   CREATININE 0.94 08/14/2014 1511    CALCIUM 9.4 08/14/2014 1511   GFRNONAA >90 08/14/2014 1511   GFRAA >90 08/14/2014 1511       Component Value Date/Time   WBC 6.1 07/03/2020 1102   RBC 4.77 07/03/2020 1102   HGB 15.7 07/03/2020 1102   HGB 14.3 06/19/2020 1638   HCT 44.1 07/03/2020 1102   HCT 42.5 06/19/2020 1638   PLT 223.0 07/03/2020 1102   PLT 211 06/19/2020 1638  MCV 92.4 07/03/2020 1102   MCV 94 06/19/2020 1638   MCH 31.5 06/19/2020 1638   MCH 31.8 08/14/2014 1511   MCHC 35.6 07/03/2020 1102   RDW 13.1 07/03/2020 1102   RDW 12.3 06/19/2020 1638   LYMPHSABS 1.9 07/03/2020 1102   LYMPHSABS 2.2 06/19/2020 1638   MONOABS 0.6 07/03/2020 1102   EOSABS 0.1 07/03/2020 1102   EOSABS 0.1 06/19/2020 1638   BASOSABS 0.1 07/03/2020 1102   BASOSABS 0.1 06/19/2020 1638    No results found for: POCLITH, LITHIUM   No results found for: PHENYTOIN, PHENOBARB, VALPROATE, CBMZ   .res Assessment: Plan:    Ryan Baker was seen today for follow-up.  Diagnoses and all orders for this visit:  Attention deficit hyperactivity disorder (ADHD), predominantly inattentive type -     amphetamine -dextroamphetamine  (ADDERALL) 10 MG tablet; Take 1 tablet (10 mg total) by mouth 2 (two) times daily with a meal.  Generalized anxiety disorder -     clonazePAM  (KLONOPIN ) 1 MG tablet; Take 1 tablet (1 mg total) by mouth 2 (two) times daily as needed for anxiety.  Insomnia due to other mental disorder -     clonazePAM  (KLONOPIN ) 1 MG tablet; Take 1 tablet (1 mg total) by mouth 2 (two) times daily as needed for anxiety. -     zolpidem  (AMBIEN  CR) 12.5 MG CR tablet; Take 1 tablet (12.5 mg total) by mouth at bedtime.  Other orders -     lamoTRIgine  (LAMICTAL ) 100 MG tablet; Take 1 tablet (100 mg total) by mouth daily.    Overall he is doing well with long-term medications Adderall 10 mg twice daily, clonazepam  1 mg daily on average, zolpidem  Ambien  CR 12.5 nightly for sleep and lamotrigine  100 mg daily for mood and  irritability. Is not having side effects of these medications.  He has has a good response to the medications and does not want changes in the medication.  We discussed the short-term risks associated with benzodiazepines including sedation and increased fall risk among others.  Discussed long-term side effect risk including dependence, potential withdrawal symptoms, and the potential eventual dose-related risk of dementia.  But recent studies from 2020 dispute this association between benzodiazepines and dementia risk. Newer studies in 2020 do not support an association with dementia.  Discussed potential benefits, risks, and side effects of stimulants with patient to include increased heart rate, palpitations, insomnia, increased anxiety, increased irritability, or decreased appetite.  Instructed patient to contact office if experiencing any significant tolerability issues. Discussed long-acting versus short acting stimulants and he prefers the shorter acting.  Counselng 20 min: On the issues around how to deal with his sons academic problems which are believed to be the result of ADD symptoms and social anxiety, versus behavioral problems with lack of motivation.  Discussed the pros and cons of different approaches and boundaries and limit setting.  He needed discussed ways to get him assessed and into evaluation times a week for conditions that could be interfering with his academic performance.  No med changes  Follow-up 11-month  Lorene Macintosh MD, DFAPA    Please see After Visit Summary for patient specific instructions.    Future Appointments  Date Time Provider Department Center  06/04/2024  1:00 PM Cottle, Lorene KANDICE Raddle., MD [facility] None    No orders of the defined types were placed in this encounter.   -------------------------------

## 2024-01-09 ENCOUNTER — Encounter: Payer: 59 | Admitting: Urology

## 2024-01-09 NOTE — Progress Notes (Deleted)
 Assessment: 1. BPH with obstruction/lower urinary tract symptoms   2. Hypogonadism in male      Plan: I personally reviewed the patient's chart including provider notes, ***.   Chief Complaint: No chief complaint on file.   History of Present Illness:  Ryan Baker is a 58 y.o. male who is seen for evaluation of prostatosis syndrome, BPH with urinary obstruction, and hypogonadism. He was previously followed by Dr. Lindley Magnus with [facility] urology in [place].  His last visit was in March 2024.  He has a history of BPH with lower urinary tract symptoms.  He has been managed with tadalafil 5 mg daily.  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  He has noted improvement with anti-inflammatory medication.  He also has a history of hypergonadism.  He has been managed with testosterone replacement therapy with Axiron 2 pumps under each arm daily.  Labs from 3/24: Testosterone 1253 PSA 0.4   Past Medical History:  Past Medical History:  Diagnosis Date   Anxiety    generalized anxiety disorder   Asthma    Coronary artery disease    Depression    Eczema of scalp    Enlarged prostate    Family history of anesthesia complication    Father vomits   GERD (gastroesophageal reflux disease)    Hyperlipemia    Prostatitis     Past Surgical History:  Past Surgical History:  Procedure Laterality Date   CORONARY ANGIOPLASTY     sept 2013   CORONARY ANGIOPLASTY WITH STENT PLACEMENT     ESOPHAGOGASTRODUODENOSCOPY     SHOULDER ARTHROSCOPY Left 08/15/2014   Procedure: LEFT ARTHROSCOPY SHOULDER WITH ACROMIO CLAVICULAR RESECTION;  Surgeon: Verlee Rossetti, MD;  Location: [facility] OR;  Service: Orthopedics;  Laterality: Left;   VASECTOMY     WISDOM TOOTH EXTRACTION      Allergies:  Allergies  Allergen Reactions   Amoxicillin Diarrhea   Flomax [Tamsulosin Hcl] Palpitations   Flomax [Tamsulosin Hcl] Palpitations   Tamsulosin Palpitations     Family History:  Family History  Problem Relation Age of Onset   Anxiety disorder Mother    Hypertension Father    Allergic rhinitis Sister    Hypertension Other    Heart disease Other    Angioedema Neg Hx    Asthma Neg Hx    Eczema Neg Hx    Immunodeficiency Neg Hx    Urticaria Neg Hx     Social History:  Social History   Tobacco Use   Smoking status: Never   Smokeless tobacco: Never  Vaping Use   Vaping status: Never Used  Substance Use Topics   Alcohol use: No    Comment: social- rare   Drug use: No    Review of symptoms:  Constitutional:  Negative for unexplained weight loss, night sweats, fever, chills ENT:  Negative for nose bleeds, sinus pain, painful swallowing CV:  Negative for chest pain, shortness of breath, exercise intolerance, palpitations, loss of consciousness Resp:  Negative for cough, wheezing, shortness of breath GI:  Negative for nausea, vomiting, diarrhea, bloody stools GU:  Positives noted in HPI; otherwise negative for gross hematuria, dysuria, urinary incontinence Neuro:  Negative for seizures, poor balance, limb weakness, slurred speech Psych:  Negative for lack of energy, depression, anxiety Endocrine:  Negative for polydipsia, polyuria, symptoms of hypoglycemia (dizziness, hunger, sweating) Hematologic:  Negative for anemia, purpura, petechia, prolonged or excessive bleeding, use of anticoagulants  Allergic:  Negative for difficulty  breathing or choking as a result of exposure to anything; no shellfish allergy; no allergic response (rash/itch) to materials, foods  Physical exam: There were no vitals taken for this visit. GENERAL APPEARANCE:  Well appearing, well developed, well nourished, NAD HEENT: Atraumatic, Normocephalic, oropharynx clear. NECK: Supple without lymphadenopathy or thyromegaly. LUNGS: Clear to auscultation bilaterally. HEART: Regular Rate and Rhythm without murmurs, gallops, or rubs. ABDOMEN: Soft, non-tender, No  Masses. EXTREMITIES: Moves all extremities well.  Without clubbing, cyanosis, or edema. NEUROLOGIC:  Alert and oriented x 3, normal gait, CN II-XII grossly intact.  MENTAL STATUS:  Appropriate. BACK:  Non-tender to palpation.  No CVAT SKIN:  Warm, dry and intact.    Results: No results found for this or any previous visit (from the past 24 hours).

## 2024-01-10 ENCOUNTER — Telehealth: Payer: Self-pay | Admitting: Urology

## 2024-01-10 ENCOUNTER — Encounter: Payer: 59 | Admitting: Urology

## 2024-01-10 ENCOUNTER — Other Ambulatory Visit: Payer: Self-pay | Admitting: Urology

## 2024-01-10 MED ORDER — TESTOSTERONE 30 MG/ACT TD SOLN: TRANSDERMAL | 0 refills | Status: DC

## 2024-01-10 NOTE — Telephone Encounter (Signed)
testosterone 30 mg / Generic for axiron. has 2 pumps under each arm daily. total of 4 pumps daily. .  will need one refill before appt date on 03/12. last refill is on 03/07

## 2024-01-31 ENCOUNTER — Encounter: Payer: Self-pay | Admitting: Urology

## 2024-01-31 ENCOUNTER — Ambulatory Visit: Payer: Self-pay | Admitting: Urology

## 2024-01-31 VITALS — BP 148/91 | HR 74 | Ht 69.0 in | Wt 156.0 lb

## 2024-01-31 DIAGNOSIS — E291 Testicular hypofunction: Secondary | ICD-10-CM

## 2024-01-31 DIAGNOSIS — Z87438 Personal history of other diseases of male genital organs: Secondary | ICD-10-CM | POA: Diagnosis not present

## 2024-01-31 DIAGNOSIS — N401 Enlarged prostate with lower urinary tract symptoms: Secondary | ICD-10-CM

## 2024-01-31 DIAGNOSIS — N138 Other obstructive and reflux uropathy: Secondary | ICD-10-CM

## 2024-01-31 MED ORDER — TADALAFIL 10 MG PO TABS: 10.0000 mg | ORAL_TABLET | Freq: Every day | ORAL | 3 refills | Status: DC | PRN

## 2024-01-31 MED ORDER — TADALAFIL 5 MG PO TABS: 5.0000 mg | ORAL_TABLET | Freq: Every day | ORAL | 3 refills | Status: AC

## 2024-01-31 MED ORDER — TESTOSTERONE 30 MG/ACT TD SOLN: TRANSDERMAL | 5 refills | Status: DC

## 2024-01-31 NOTE — Progress Notes (Signed)
 Assessment: 1. Hypogonadism in male   2. BPH with obstruction/lower urinary tract symptoms   3. History of prostatitis     Plan: I personally reviewed the patient's chart including provider notes, and lab results. Continue Axiron 4 pumps daily.  Refill sent to [facility]. Continue tadalafil 7.5 mg daily.  Prescriptions provided for use with good Rx. Return to office in 6 months.  Chief Complaint:  Chief Complaint  Patient presents with   Hypogonadism    History of Present Illness:  Ryan Baker is a 58 y.o. male who is seen for evaluation of male hypogonadism, BPH with lower urinary tract symptoms, and history of prostatosis. He has been followed by Dr. Lindley Magnus with [facility] urology in [place].  His last visit was in March 2024. He has a history of hypogonadism that has been managed with Axiron.  He has had good symptomatic relief with this treatment. Labs from 2/25: Testosterone 696 H&H  14.7/40.9 PSA  0.35  He has a history of BPH with lower urinary tract symptoms.  This has been managed with tadalafil 7.5 mg daily. His urinary symptoms have been very stable.  No dysuria or gross hematuria. IPSS = 8/1 today.  No recent episodes of prostatitis.  He does report some discomfort in the prostate area after orgasm.   Past Medical History:  Past Medical History:  Diagnosis Date   Anxiety    generalized anxiety disorder   Asthma    Coronary artery disease    Depression    Eczema of scalp    Enlarged prostate    Family history of anesthesia complication    Father vomits   GERD (gastroesophageal reflux disease)    Hyperlipemia    Prostatitis     Past Surgical History:  Past Surgical History:  Procedure Laterality Date   CORONARY ANGIOPLASTY     sept 2013   CORONARY ANGIOPLASTY WITH STENT PLACEMENT     ESOPHAGOGASTRODUODENOSCOPY     SHOULDER ARTHROSCOPY Left 08/15/2014   Procedure: LEFT ARTHROSCOPY SHOULDER WITH ACROMIO CLAVICULAR RESECTION;  Surgeon: Verlee Rossetti, MD;  Location: [facility] OR;  Service: Orthopedics;  Laterality: Left;   VASECTOMY     WISDOM TOOTH EXTRACTION      Allergies:  Allergies  Allergen Reactions   Amoxicillin Diarrhea   Flomax [Tamsulosin Hcl] Palpitations   Flomax [Tamsulosin Hcl] Palpitations   Tamsulosin Palpitations    Family History:  Family History  Problem Relation Age of Onset   Anxiety disorder Mother    Hypertension Father    Allergic rhinitis Sister    Hypertension Other    Heart disease Other    Angioedema Neg Hx    Asthma Neg Hx    Eczema Neg Hx    Immunodeficiency Neg Hx    Urticaria Neg Hx     Social History:  Social History   Tobacco Use   Smoking status: Never   Smokeless tobacco: Never  Vaping Use   Vaping status: Never Used  Substance Use Topics   Alcohol use: No    Comment: social- rare   Drug use: No    Review of symptoms:  Constitutional:  Negative for unexplained weight loss, night sweats, fever, chills ENT:  Negative for nose bleeds, sinus pain, painful swallowing CV:  Negative for chest pain, shortness of breath, exercise intolerance, palpitations, loss of consciousness Resp:  Negative for cough, wheezing, shortness of breath GI:  Negative for nausea, vomiting, diarrhea, bloody stools GU:  Positives noted in HPI; otherwise  negative for gross hematuria, dysuria, urinary incontinence Neuro:  Negative for seizures, poor balance, limb weakness, slurred speech Psych:  Negative for lack of energy, depression, anxiety Endocrine:  Negative for polydipsia, polyuria, symptoms of hypoglycemia (dizziness, hunger, sweating) Hematologic:  Negative for anemia, purpura, petechia, prolonged or excessive bleeding, use of anticoagulants  Allergic:  Negative for difficulty breathing or choking as a result of exposure to anything; no shellfish allergy; no allergic response (rash/itch) to materials, foods  Physical exam: BP (!) 148/91   Pulse 74   Ht 5\' 9"  (1.753 m)   Wt 156 lb (70.8 kg)    BMI 23.04 kg/m  GENERAL APPEARANCE:  Well appearing, well developed, well nourished, NAD HEENT: Atraumatic, Normocephalic, oropharynx clear. NECK: Supple without lymphadenopathy or thyromegaly. LUNGS: Clear to auscultation bilaterally. HEART: Regular Rate and Rhythm without murmurs, gallops, or rubs. ABDOMEN: Soft, non-tender, No Masses. EXTREMITIES: Moves all extremities well.  Without clubbing, cyanosis, or edema. NEUROLOGIC:  Alert and oriented x 3, normal gait, CN II-XII grossly intact.  MENTAL STATUS:  Appropriate. BACK:  Non-tender to palpation.  No CVAT SKIN:  Warm, dry and intact.    Results: None

## 2024-04-04 ENCOUNTER — Other Ambulatory Visit: Payer: Self-pay | Admitting: Psychiatry

## 2024-04-04 DIAGNOSIS — F5105 Insomnia due to other mental disorder: Secondary | ICD-10-CM

## 2024-04-22 ENCOUNTER — Other Ambulatory Visit: Payer: Self-pay | Admitting: Psychiatry

## 2024-04-22 DIAGNOSIS — F900 Attention-deficit hyperactivity disorder, predominantly inattentive type: Secondary | ICD-10-CM

## 2024-05-14 ENCOUNTER — Other Ambulatory Visit: Payer: Self-pay | Admitting: Urology

## 2024-05-14 DIAGNOSIS — E291 Testicular hypofunction: Secondary | ICD-10-CM

## 2024-05-17 ENCOUNTER — Other Ambulatory Visit: Payer: Self-pay | Admitting: Psychiatry

## 2024-05-17 DIAGNOSIS — F5105 Insomnia due to other mental disorder: Secondary | ICD-10-CM

## 2024-05-17 DIAGNOSIS — F411 Generalized anxiety disorder: Secondary | ICD-10-CM

## 2024-05-20 ENCOUNTER — Other Ambulatory Visit: Payer: Self-pay | Admitting: Urology

## 2024-05-20 DIAGNOSIS — E291 Testicular hypofunction: Secondary | ICD-10-CM

## 2024-05-20 MED ORDER — TESTOSTERONE 30 MG/ACT TD SOLN: TRANSDERMAL | 5 refills | Status: DC

## 2024-05-23 ENCOUNTER — Encounter: Payer: Self-pay | Admitting: Psychiatry

## 2024-05-23 ENCOUNTER — Telehealth: Admitting: Psychiatry

## 2024-05-23 DIAGNOSIS — F411 Generalized anxiety disorder: Secondary | ICD-10-CM | POA: Diagnosis not present

## 2024-05-23 DIAGNOSIS — F5105 Insomnia due to other mental disorder: Secondary | ICD-10-CM | POA: Diagnosis not present

## 2024-05-23 DIAGNOSIS — F900 Attention-deficit hyperactivity disorder, predominantly inattentive type: Secondary | ICD-10-CM | POA: Diagnosis not present

## 2024-05-23 DIAGNOSIS — F99 Mental disorder, not otherwise specified: Secondary | ICD-10-CM | POA: Diagnosis not present

## 2024-05-23 MED ORDER — CLONAZEPAM 1 MG PO TABS: 1.0000 mg | ORAL_TABLET | Freq: Two times a day (BID) | ORAL | 0 refills | Status: DC | PRN

## 2024-05-23 MED ORDER — LAMOTRIGINE 100 MG PO TABS: 100.0000 mg | ORAL_TABLET | Freq: Every day | ORAL | 1 refills | Status: AC

## 2024-05-23 MED ORDER — ZOLPIDEM TARTRATE ER 12.5 MG PO TBCR: 12.5000 mg | EXTENDED_RELEASE_TABLET | Freq: Every day | ORAL | 1 refills | Status: DC

## 2024-05-23 MED ORDER — AMPHETAMINE-DEXTROAMPHETAMINE 10 MG PO TABS: 10.0000 mg | ORAL_TABLET | Freq: Two times a day (BID) | ORAL | 0 refills | Status: AC

## 2024-05-23 NOTE — Progress Notes (Signed)
 Ryan Baker [id number] [birth year] 58 y.o.  Video Visit via My Chart  I connected with pt by video using My Chart and verified that I am speaking with the correct person using two identifiers.   I discussed the limitations, risks, security and privacy concerns of performing an evaluation and management service by My Chart  and the availability of in person appointments. I also discussed with the patient that there may be a patient responsible charge related to this service. The patient expressed understanding and agreed to proceed.  I discussed the assessment and treatment plan with the patient. The patient was provided an opportunity to ask questions and all were answered. The patient agreed with the plan and demonstrated an understanding of the instructions.   The patient was advised to call back or seek an in-person evaluation if the symptoms worsen or if the condition fails to improve as anticipated.  I provided 30 minutes of video time during this encounter.  The patient was located at home and the provider was located office. Session 230-300 pm  Subjective:   Patient ID:  Ryan Baker is a 58 y.o. (DOB 09/13/1966) male.  Chief Complaint:  Chief Complaint  Patient presents with   Follow-up   Anxiety   ADD   Sleeping Problem    HPI Ryan Baker presents to the office today for follow-up of ADD, sleep and anxiety.  12/05/23 appt noted:  Psych med:  Adderall 10 mg BID, clonazepam  1 mg BID prn, usually 1 mg nightly, lamotrigine  125 down to 100 mg daily. Ambien  prn insomnia.  Better sleep on weekends without Adderall.   No there SE. Sleep pretty good usually.  Adderall is effective.  Patient reports stable mood and denies depressed or irritable moods.  Patient denies any recent difficulty with anxiety.  Patient denies difficulty with sleep initiation or maintenance. Denies appetite disturbance.  Patient reports that energy and motivation have been good.  Patient denies any  difficulty with concentration.  Patient denies any suicidal ideation.   05/22/24 appt noted:  Med: Adderall 10 mg BID, clonazepam  1 mg BID prn, usually 1 mg nightly, lamotrigine  100 mg daily.  Ambien  CR prn insomnia.  Longstanding ins issues since 30's.   Latest caffeine is about noon.  2 shots in am and 1/2 Coke lunch.   Ambien  helps getting to sleep and lasts about 5 hour. Don't always need something to get to sleep.  Would like to get more sleep.   Not generally dep or anxious usually now.   Would like marriage thx.   F died of CA recently and M died from dementia.    Stressor son Chauncey with academic px, social anxiety, ? ADD, poor school performance.  Past psychiatric medication trials: Pristiq, Cymbalta, Trintellix Celexa, Zoloft, Viibryd, Prozac, Effexor, Lexapro Wellbutrin, Amitriptyline Lamotrigine   Propranolol BuSpar Gabapentin  Trazodone  hangover Lunesta, Ambien , Seroquel Xanax, Ativan, Clonazepam    PHQ2-9    Flowsheet Row Office Visit from 10/02/2023 in [facility]  PHQ-2 Total Score 0     Review of Systems:  Review of Systems  Cardiovascular:  Negative for palpitations.  Neurological:  Negative for tremors and weakness.    Medications: I have reviewed the patient's current medications.  Current Outpatient Medications  Medication Sig Dispense Refill   albuterol  (VENTOLIN  HFA) 108 (90 Base) MCG/ACT inhaler SMARTSIG:2 Puff(s) By Mouth 4 Times Daily     amLODipine (NORVASC) 2.5 MG tablet Take by mouth.  aspirin 81 MG tablet Take 81 mg by mouth daily.     Carbinoxamine  Maleate 4 MG TABS Take 4 mg every 6-8 hours as needed 28 tablet 5   Cholecalciferol (VITAMIN D) 2000 units tablet Take by mouth.     Cyanocobalamin (VITAMIN B-12 CR) 1000 MCG TBCR Take as directed.     esomeprazole  (NEXIUM ) 40 MG capsule Take 1 capsule daily for reflux. 30 capsule 0   fexofenadine (ALLEGRA) 60 MG tablet Take 60 mg by mouth 2 (two) times daily.      Fluticasone Propionate  (XHANCE ) 93 MCG/ACT EXHU Place 2 application into the nose in the morning and at bedtime. 16 mL 5   Fluticasone-Umeclidin-Vilant (TRELEGY ELLIPTA ) 200-62.5-25 MCG/INH AEPB Inhale 1 puff into the lungs daily. 60 each 12   Ginkgo Biloba Extract (GINKGO BILOBA MEMORY ENHANCER) 60 MG CAPS Take 1 tablet by mouth 2 (two) times daily.     ibuprofen  (ADVIL ,MOTRIN ) 600 MG tablet Take 1 tablet (600 mg total) by mouth every 6 (six) hours as needed for pain. 30 tablet 0   levalbuterol  (XOPENEX  HFA) 45 MCG/ACT inhaler Inhale 2 puffs into the lungs every 6 (six) hours as needed for wheezing. 1 Inhaler 2   mometasone  (NASONEX ) 50 MCG/ACT nasal spray ONE SPRAY EACH NOSTRIL 1-2 TIMES  DAY AS NEEDED FOR NASAL CONGESTION OR DRAINAGE. 17 g 0   Multiple Vitamin (MULTI-VITAMINS) TABS Frequency:daily   Dosage:0.0     Instructions:Multivitamin ( TABS, 1 Oral daily)  Note:     Olopatadine HCl (PATADAY) 0.2 % SOLN as needed.     rosuvastatin (CRESTOR) 10 MG tablet Take by mouth.     tadalafil  (CIALIS ) 10 MG tablet Take 1 tablet (10 mg total) by mouth daily as needed for erectile dysfunction. 90 tablet 3   tadalafil  (CIALIS ) 5 MG tablet Take 1 tablet (5 mg total) by mouth daily. 90 tablet 3   Testosterone  30 MG/ACT SOLN Apply 2 pumps under each arm daily 90 mL 5   vitamin C (ASCORBIC ACID) 500 MG tablet 500 mg.     amphetamine -dextroamphetamine  (ADDERALL) 10 MG tablet Take 1 tablet (10 mg total) by mouth 2 (two) times daily with a meal. 180 tablet 0   clonazePAM  (KLONOPIN ) 1 MG tablet Take 1 tablet (1 mg total) by mouth 2 (two) times daily as needed for anxiety. 60 tablet 0   lamoTRIgine  (LAMICTAL ) 100 MG tablet Take 1 tablet (100 mg total) by mouth daily. 90 tablet 1   olmesartan (BENICAR) 40 MG tablet Take by mouth.     zolpidem  (AMBIEN  CR) 12.5 MG CR tablet Take 1 tablet (12.5 mg total) by mouth at bedtime. 30 tablet 1   Current Facility-Administered Medications  Medication Dose Route Frequency  Provider Last Rate Last Admin   predniSONE  (DELTASONE ) tablet 10 mg  10 mg Oral UD Bobbitt, Elgin Pepper, MD        Medication Side Effects: None  Allergies:  Allergies  Allergen Reactions   Amoxicillin Diarrhea   Flomax [Tamsulosin Hcl] Palpitations   Flomax [Tamsulosin Hcl] Palpitations   Tamsulosin Palpitations    Past Medical History:  Diagnosis Date   Anxiety    generalized anxiety disorder   Asthma    Coronary artery disease    Depression    Eczema of scalp    Enlarged prostate    Family history of anesthesia complication    Father vomits   GERD (gastroesophageal reflux disease)    Hyperlipemia    Prostatitis  Past Medical History, Surgical history, Social history, and Family history were reviewed and updated as appropriate.   Please see review of systems for further details on the patient's review from today.   Objective:   Physical Exam:  There were no vitals taken for this visit.  Physical Exam Constitutional:      General: He is not in acute distress.    Appearance: He is well-developed.  Musculoskeletal:        General: No deformity.  Neurological:     Mental Status: He is alert and oriented to person, place, and time.     Coordination: Coordination normal.  Psychiatric:        Attention and Perception: Attention and perception normal.        Mood and Affect: Mood is not anxious or depressed. Affect is not labile, blunt, angry or inappropriate.        Speech: Speech normal.        Behavior: Behavior normal.        Thought Content: Thought content normal. Thought content is not delusional. Thought content does not include homicidal or suicidal ideation. Thought content does not include suicidal plan.        Cognition and Memory: Cognition and memory normal.        Judgment: Judgment normal.     Lab Review:     Component Value Date/Time   NA 137 08/14/2014 1511   K 3.8 08/14/2014 1511   CL 100 08/14/2014 1511   CO2 26 08/14/2014 1511    GLUCOSE 108 (H) 08/14/2014 1511   BUN 10 08/14/2014 1511   CREATININE 0.94 08/14/2014 1511   CALCIUM 9.4 08/14/2014 1511   GFRNONAA >90 08/14/2014 1511   GFRAA >90 08/14/2014 1511       Component Value Date/Time   WBC 6.1 07/03/2020 1102   RBC 4.77 07/03/2020 1102   HGB 15.7 07/03/2020 1102   HGB 14.3 06/19/2020 1638   HCT 44.1 07/03/2020 1102   HCT 42.5 06/19/2020 1638   PLT 223.0 07/03/2020 1102   PLT 211 06/19/2020 1638   MCV 92.4 07/03/2020 1102   MCV 94 06/19/2020 1638   MCH 31.5 06/19/2020 1638   MCH 31.8 08/14/2014 1511   MCHC 35.6 07/03/2020 1102   RDW 13.1 07/03/2020 1102   RDW 12.3 06/19/2020 1638   LYMPHSABS 1.9 07/03/2020 1102   LYMPHSABS 2.2 06/19/2020 1638   MONOABS 0.6 07/03/2020 1102   EOSABS 0.1 07/03/2020 1102   EOSABS 0.1 06/19/2020 1638   BASOSABS 0.1 07/03/2020 1102   BASOSABS 0.1 06/19/2020 1638    No results found for: POCLITH, LITHIUM   No results found for: PHENYTOIN, PHENOBARB, VALPROATE, CBMZ   .res Assessment: Plan:    Caz Weaver was seen today for follow-up, anxiety, add and sleeping problem.  Diagnoses and all orders for this visit:  Attention deficit hyperactivity disorder (ADHD), predominantly inattentive type -     amphetamine -dextroamphetamine  (ADDERALL) 10 MG tablet; Take 1 tablet (10 mg total) by mouth 2 (two) times daily with a meal.  Insomnia due to other mental disorder -     zolpidem  (AMBIEN  CR) 12.5 MG CR tablet; Take 1 tablet (12.5 mg total) by mouth at bedtime. -     clonazePAM  (KLONOPIN ) 1 MG tablet; Take 1 tablet (1 mg total) by mouth 2 (two) times daily as needed for anxiety.  Generalized anxiety disorder -     lamoTRIgine  (LAMICTAL ) 100 MG tablet; Take 1 tablet (100 mg total) by mouth daily. -  clonazePAM  (KLONOPIN ) 1 MG tablet; Take 1 tablet (1 mg total) by mouth 2 (two) times daily as needed for anxiety.     Overall he is doing well with long-term medications Adderall 10 mg twice daily,  clonazepam  1 mg daily on average, zolpidem  Ambien  CR 12.5 nightly for sleep and lamotrigine  100 mg daily for mood and irritability. Is not having side effects of these medications.  He has has a good response to the medications and does not want changes in the medication.  We discussed the short-term risks associated with benzodiazepines including sedation and increased fall risk among others.  Discussed long-term side effect risk including dependence, potential withdrawal symptoms, and the potential eventual dose-related risk of dementia.  But recent studies from 2020 dispute this association between benzodiazepines and dementia risk. Newer studies in 2020 do not support an association with dementia.  Discussed potential benefits, risks, and side effects of stimulants with patient to include increased heart rate, palpitations, insomnia, increased anxiety, increased irritability, or decreased appetite.  Instructed patient to contact office if experiencing any significant tolerability issues. Discussed long-acting versus short acting stimulants and he prefers the shorter acting. Disc alternative sleep agents including OTC options.  Disc counseling options.    No med changes  Follow-up 6 month  Lorene Macintosh MD, DFAPA    Please see After Visit Summary for patient specific instructions.    Future Appointments  Date Time Provider Department Center  08/02/2024 10:00 AM Stoneking, Adine PARAS., MD [facility] None    No orders of the defined types were placed in this encounter.   -------------------------------

## 2024-06-04 ENCOUNTER — Ambulatory Visit: Payer: BC Managed Care – PPO | Admitting: Psychiatry

## 2024-07-05 ENCOUNTER — Encounter: Payer: Self-pay | Admitting: Urology

## 2024-07-05 ENCOUNTER — Other Ambulatory Visit: Payer: Self-pay | Admitting: Urology

## 2024-07-05 DIAGNOSIS — E291 Testicular hypofunction: Secondary | ICD-10-CM

## 2024-07-05 MED ORDER — TESTOSTERONE 30 MG/ACT TD SOLN: TRANSDERMAL | 4 refills | Status: DC

## 2024-07-08 ENCOUNTER — Other Ambulatory Visit: Payer: Self-pay | Admitting: Urology

## 2024-07-08 DIAGNOSIS — E291 Testicular hypofunction: Secondary | ICD-10-CM

## 2024-07-08 MED ORDER — TESTOSTERONE 30 MG/ACT TD SOLN: TRANSDERMAL | 4 refills | Status: AC

## 2024-08-02 ENCOUNTER — Encounter: Payer: Self-pay | Admitting: Urology

## 2024-08-02 ENCOUNTER — Ambulatory Visit: Admitting: Urology

## 2024-08-02 VITALS — BP 145/91 | HR 67 | Ht 70.0 in | Wt 155.0 lb

## 2024-08-02 DIAGNOSIS — N401 Enlarged prostate with lower urinary tract symptoms: Secondary | ICD-10-CM

## 2024-08-02 DIAGNOSIS — Z87438 Personal history of other diseases of male genital organs: Secondary | ICD-10-CM

## 2024-08-02 DIAGNOSIS — N138 Other obstructive and reflux uropathy: Secondary | ICD-10-CM

## 2024-08-02 DIAGNOSIS — E291 Testicular hypofunction: Secondary | ICD-10-CM

## 2024-08-02 MED ORDER — TADALAFIL 10 MG PO TABS: 10.0000 mg | ORAL_TABLET | Freq: Every day | ORAL | 3 refills | Status: AC | PRN

## 2024-08-02 NOTE — Progress Notes (Signed)
 Assessment: 1. Hypogonadism in male   2. BPH with obstruction/lower urinary tract symptoms   3. History of prostatitis     Plan: Labs today:  CBC, testosterone  Continue Axiron  4 pumps daily.  Refill sent to [facility] on 07/08/24. Continue tadalafil  7.5 mg daily.  Prescriptions provided for use with good Rx. Return to office in 6 months.  Chief Complaint:  Chief Complaint  Patient presents with   Hypogonadism    History of Present Illness:  Ryan Baker is a 58 y.o. male who is seen for continued evaluation of male hypogonadism, BPH with lower urinary tract symptoms, and history of prostatosis. He has been followed by Dr. Alfonzo with [facility] urology in [place].  His last visit there was in March 2024. He has a history of hypogonadism that has been managed with Axiron .  He has had good symptomatic relief with this treatment. Labs from 2/25: Testosterone  696 H&H  14.7/40.9 PSA  0.35  He continues on Axiron  4 pumps/day.  He continues with good symptomatic improvement.  He has a history of BPH with lower urinary tract symptoms.  This has been managed with tadalafil  7.5 mg daily. No new urinary symptoms.  He continues with nocturia x 2, occasional frequency and urgency.  No dysuria or gross hematuria. IPSS = 7/2.  No recent episodes of prostatitis.    Portions of the above documentation were copied from a prior visit for review purposes only.  Past Medical History:  Past Medical History:  Diagnosis Date   Anxiety    generalized anxiety disorder   Asthma    Coronary artery disease    Depression    Eczema of scalp    Enlarged prostate    Family history of anesthesia complication    Father vomits   GERD (gastroesophageal reflux disease)    Hyperlipemia    Prostatitis     Past Surgical History:  Past Surgical History:  Procedure Laterality Date   CORONARY ANGIOPLASTY     sept 2013   CORONARY ANGIOPLASTY WITH STENT PLACEMENT     ESOPHAGOGASTRODUODENOSCOPY      SHOULDER ARTHROSCOPY Left 08/15/2014   Procedure: LEFT ARTHROSCOPY SHOULDER WITH ACROMIO CLAVICULAR RESECTION;  Surgeon: Elspeth JONELLE Her, MD;  Location: [facility] OR;  Service: Orthopedics;  Laterality: Left;   VASECTOMY     WISDOM TOOTH EXTRACTION      Allergies:  Allergies  Allergen Reactions   Amoxicillin Diarrhea   Flomax [Tamsulosin Hcl] Palpitations   Flomax [Tamsulosin Hcl] Palpitations   Tamsulosin Palpitations    Family History:  Family History  Problem Relation Age of Onset   Anxiety disorder Mother    Hypertension Father    Allergic rhinitis Sister    Hypertension Other    Heart disease Other    Angioedema Neg Hx    Asthma Neg Hx    Eczema Neg Hx    Immunodeficiency Neg Hx    Urticaria Neg Hx     Social History:  Social History   Tobacco Use   Smoking status: Never   Smokeless tobacco: Never  Vaping Use   Vaping status: Never Used  Substance Use Topics   Alcohol use: No    Comment: social- rare   Drug use: No    ROS: Constitutional:  Negative for fever, chills, weight loss CV: Negative for chest pain, previous MI, hypertension Respiratory:  Negative for shortness of breath, wheezing, sleep apnea, frequent cough GI:  Negative for nausea, vomiting, bloody stool, GERD  Physical exam: BP ROLLEN)  145/91   Pulse 67   Ht 5' 10 (1.778 m)   Wt 155 lb (70.3 kg)   BMI 22.24 kg/m  GENERAL APPEARANCE:  Well appearing, well developed, well nourished, NAD HEENT:  Atraumatic, normocephalic, oropharynx clear NECK:  Supple without lymphadenopathy or thyromegaly ABDOMEN:  Soft, non-tender, no masses EXTREMITIES:  Moves all extremities well, without clubbing, cyanosis, or edema NEUROLOGIC:  Alert and oriented x 3, normal gait, CN II-XII grossly intact MENTAL STATUS:  appropriate BACK:  Non-tender to palpation, No CVAT SKIN:  Warm, dry, and intact  Results: None

## 2024-08-03 ENCOUNTER — Ambulatory Visit: Payer: Self-pay | Admitting: Urology

## 2024-08-03 LAB — CBC
Hematocrit: 42.7 % (ref 37.5–51.0)
Hemoglobin: 14 g/dL (ref 13.0–17.7)
MCH: 31.5 pg (ref 26.6–33.0)
MCHC: 32.8 g/dL (ref 31.5–35.7)
MCV: 96 fL (ref 79–97)
Platelets: 244 x10E3/uL (ref 150–450)
RBC: 4.44 x10E6/uL (ref 4.14–5.80)
RDW: 11.9 % (ref 11.6–15.4)
WBC: 5.9 x10E3/uL (ref 3.4–10.8)

## 2024-08-03 LAB — TESTOSTERONE: Testosterone: 822 ng/dL (ref 264–916)

## 2024-08-31 ENCOUNTER — Other Ambulatory Visit: Payer: Self-pay | Admitting: Psychiatry

## 2024-08-31 DIAGNOSIS — F411 Generalized anxiety disorder: Secondary | ICD-10-CM

## 2024-08-31 DIAGNOSIS — F5105 Insomnia due to other mental disorder: Secondary | ICD-10-CM

## 2024-10-15 ENCOUNTER — Encounter: Payer: Self-pay | Admitting: Urology

## 2024-10-16 ENCOUNTER — Other Ambulatory Visit: Payer: Self-pay | Admitting: Urology

## 2024-10-16 MED ORDER — CIPROFLOXACIN HCL 500 MG PO TABS: 500.0000 mg | ORAL_TABLET | Freq: Two times a day (BID) | ORAL | 0 refills | Status: AC

## 2024-10-20 NOTE — Progress Notes (Incomplete)
   Assessment:    Plan:   Chief Complaint:  No chief complaint on file.   History of Present Illness:  3.12.2025: Initial visit at [facility] for continued evaluation of male hypogonadism, BPH with lower urinary tract symptoms, and history of prostatosis. He had been followed by Dr. Alfonzo with [facility] urology in [place].  His last visit there was in March 2024. He has a history of hypogonadism that has been managed with Axiron .  He has had good symptomatic relief with this treatment. Labs from 2/25: Testosterone  696 H&H  14.7/40.9 PSA  0.35  He continues on Axiron  4 pumps/day.  He continues with good symptomatic improvement.  He has a history of BPH with lower urinary tract symptoms.  This has been managed with tadalafil  7.5 mg daily. No new urinary symptoms.  He continues with nocturia x 2, occasional frequency and urgency.  No dysuria or gross hematuria. IPSS = 7/2.    Past Medical History:  Past Medical History:  Diagnosis Date   Anxiety    generalized anxiety disorder   Asthma    Coronary artery disease    Depression    Eczema of scalp    Enlarged prostate    Family history of anesthesia complication    Father vomits   GERD (gastroesophageal reflux disease)    Hyperlipemia    Prostatitis     Past Surgical History:  Past Surgical History:  Procedure Laterality Date   CORONARY ANGIOPLASTY     sept 2013   CORONARY ANGIOPLASTY WITH STENT PLACEMENT     ESOPHAGOGASTRODUODENOSCOPY     SHOULDER ARTHROSCOPY Left 08/15/2014   Procedure: LEFT ARTHROSCOPY SHOULDER WITH ACROMIO CLAVICULAR RESECTION;  Surgeon: Elspeth JONELLE Her, MD;  Location: [facility] OR;  Service: Orthopedics;  Laterality: Left;   VASECTOMY     WISDOM TOOTH EXTRACTION      Allergies:  Allergies  Allergen Reactions   Amoxicillin Diarrhea   Flomax [Tamsulosin Hcl] Palpitations   Flomax [Tamsulosin Hcl] Palpitations   Tamsulosin Palpitations    Family History:  Family History  Problem  Relation Age of Onset   Anxiety disorder Mother    Hypertension Father    Allergic rhinitis Sister    Hypertension Other    Heart disease Other    Angioedema Neg Hx    Asthma Neg Hx    Eczema Neg Hx    Immunodeficiency Neg Hx    Urticaria Neg Hx     Social History:  Social History   Tobacco Use   Smoking status: Never   Smokeless tobacco: Never  Vaping Use   Vaping status: Never Used  Substance Use Topics   Alcohol use: No    Comment: social- rare   Drug use: No    ROS: Constitutional:  Negative for fever, chills, weight loss CV: Negative for chest pain, previous MI, hypertension Respiratory:  Negative for shortness of breath, wheezing, sleep apnea, frequent cough GI:  Negative for nausea, vomiting, bloody stool, GERD  Physical exam: There were no vitals taken for this visit. GENERAL APPEARANCE:  Well appearing, well developed, well nourished, NAD HEENT:  Atraumatic, normocephalic, oropharynx clear NECK:  Supple without lymphadenopathy or thyromegaly ABDOMEN:  Soft, non-tender, no masses EXTREMITIES:  Moves all extremities well, without clubbing, cyanosis, or edema NEUROLOGIC:  Alert and oriented x 3, normal gait, CN II-XII grossly intact MENTAL STATUS:  appropriate BACK:  Non-tender to palpation, No CVAT SKIN:  Warm, dry, and intact  Results: None

## 2024-10-21 ENCOUNTER — Ambulatory Visit: Admitting: Urology

## 2024-10-21 DIAGNOSIS — N138 Other obstructive and reflux uropathy: Secondary | ICD-10-CM

## 2024-10-21 DIAGNOSIS — Z87438 Personal history of other diseases of male genital organs: Secondary | ICD-10-CM

## 2024-10-21 DIAGNOSIS — E291 Testicular hypofunction: Secondary | ICD-10-CM

## 2024-10-23 ENCOUNTER — Encounter: Payer: Self-pay | Admitting: Urology

## 2024-10-23 ENCOUNTER — Ambulatory Visit: Admitting: Urology

## 2024-10-23 VITALS — BP 156/83 | HR 102 | Ht 69.0 in | Wt 154.0 lb

## 2024-10-23 DIAGNOSIS — N401 Enlarged prostate with lower urinary tract symptoms: Secondary | ICD-10-CM

## 2024-10-23 DIAGNOSIS — E291 Testicular hypofunction: Secondary | ICD-10-CM

## 2024-10-23 DIAGNOSIS — N419 Inflammatory disease of prostate, unspecified: Secondary | ICD-10-CM | POA: Diagnosis not present

## 2024-10-23 DIAGNOSIS — N138 Other obstructive and reflux uropathy: Secondary | ICD-10-CM | POA: Diagnosis not present

## 2024-10-23 LAB — URINALYSIS, ROUTINE W REFLEX MICROSCOPIC
Bilirubin, UA: NEGATIVE
Glucose, UA: NEGATIVE
Ketones, UA: NEGATIVE
Leukocytes,UA: NEGATIVE
Nitrite, UA: NEGATIVE
Protein,UA: NEGATIVE
RBC, UA: NEGATIVE
Specific Gravity, UA: 1.01 (ref 1.005–1.030)
Urobilinogen, Ur: 0.2 mg/dL (ref 0.2–1.0)
pH, UA: 7 (ref 5.0–7.5)

## 2024-10-23 NOTE — Progress Notes (Signed)
 Assessment: 1. Prostatitis, unspecified prostatitis type   2. BPH with obstruction/lower urinary tract symptoms   3. Hypogonadism in male     Plan: Continue Cipro  x 3 weeks He will contact me if his symptoms do not continue to improve. Keep f/u as scheduled  Chief Complaint:  Chief Complaint  Patient presents with   Prostatitis    History of Present Illness:  Ryan Baker is a 58 y.o. male who is seen for evaluation of prostatitis symptoms.  He is followed for male hypogonadism, BPH with lower urinary tract symptoms, and history of prostatosis. He had been followed by Dr. Alfonzo with [facility] urology in [place].  His last visit there was in March 2024. He has a history of hypogonadism that has been managed with Axiron .  He has had good symptomatic relief with this treatment. Labs from 2/25: Testosterone  696 H&H  14.7/40.9 PSA  0.35  He continued on Axiron  4 pumps/day with good symptomatic improvement. Labs from 9/25:  Testosterone  822 H&H   14/42.7  He has a history of BPH with lower urinary tract symptoms.  This has been managed with tadalafil  7.5 mg daily. At his visit in 9/25, he reported no new urinary symptoms.  He continued with nocturia x 2, occasional frequency and urgency.  No dysuria or gross hematuria. IPSS = 7/2.  He has had gradual worsening symptoms of prostatitis for approximately 6 weeks.  His symptoms include dysuria, frequency, urgency, decreased stream, postvoid dribbling, groin pain, pain after ejaculation and discomfort with sitting.  He was started on Cipro  500 mg twice daily x 20 days on 10/16/2024.  He has noted improvement in his symptoms within the past 24 hours.  He is having less discomfort in the prostate area.  He did not have pain with ejaculation last night. IPSS = 22/4.  Portions of the above documentation were copied from a prior visit for review purposes only.  Past Medical History:  Past Medical History:  Diagnosis Date    Anxiety    generalized anxiety disorder   Asthma    Coronary artery disease    Depression    Eczema of scalp    Enlarged prostate    Family history of anesthesia complication    Father vomits   GERD (gastroesophageal reflux disease)    Hyperlipemia    Prostatitis     Past Surgical History:  Past Surgical History:  Procedure Laterality Date   CORONARY ANGIOPLASTY     sept 2013   CORONARY ANGIOPLASTY WITH STENT PLACEMENT     ESOPHAGOGASTRODUODENOSCOPY     SHOULDER ARTHROSCOPY Left 08/15/2014   Procedure: LEFT ARTHROSCOPY SHOULDER WITH ACROMIO CLAVICULAR RESECTION;  Surgeon: Elspeth JONELLE Her, MD;  Location: [facility] OR;  Service: Orthopedics;  Laterality: Left;   VASECTOMY     WISDOM TOOTH EXTRACTION      Allergies:  Allergies  Allergen Reactions   Amoxicillin Diarrhea   Flomax [Tamsulosin Hcl] Palpitations   Flomax [Tamsulosin Hcl] Palpitations   Tamsulosin Palpitations    Family History:  Family History  Problem Relation Age of Onset   Anxiety disorder Mother    Hypertension Father    Allergic rhinitis Sister    Hypertension Other    Heart disease Other    Angioedema Neg Hx    Asthma Neg Hx    Eczema Neg Hx    Immunodeficiency Neg Hx    Urticaria Neg Hx     Social History:  Social History   Tobacco Use  Smoking status: Never   Smokeless tobacco: Never  Vaping Use   Vaping status: Never Used  Substance Use Topics   Alcohol use: No    Comment: social- rare   Drug use: No    ROS: Constitutional:  Negative for fever, chills, weight loss CV: Negative for chest pain, previous MI, hypertension Respiratory:  Negative for shortness of breath, wheezing, sleep apnea, frequent cough GI:  Negative for nausea, vomiting, bloody stool, GERD  Physical exam: BP (!) 156/83   Pulse (!) 102   Ht 5' 9 (1.753 m)   Wt 154 lb (69.9 kg)   BMI 22.74 kg/m  GENERAL APPEARANCE:  Well appearing, well developed, well nourished, NAD HEENT:  Atraumatic, normocephalic, oropharynx  clear NECK:  Supple without lymphadenopathy or thyromegaly ABDOMEN:  Soft, non-tender, no masses EXTREMITIES:  Moves all extremities well, without clubbing, cyanosis, or edema NEUROLOGIC:  Alert and oriented x 3, normal gait, CN II-XII grossly intact MENTAL STATUS:  appropriate BACK:  Non-tender to palpation, No CVAT SKIN:  Warm, dry, and intact  Results: U/A: negative

## 2024-10-31 ENCOUNTER — Encounter: Payer: Self-pay | Admitting: Urology

## 2024-11-11 ENCOUNTER — Other Ambulatory Visit: Payer: Self-pay | Admitting: Urology

## 2024-11-11 ENCOUNTER — Encounter: Payer: Self-pay | Admitting: Urology

## 2024-11-11 MED ORDER — CIPROFLOXACIN HCL 500 MG PO TABS: 500.0000 mg | ORAL_TABLET | Freq: Two times a day (BID) | ORAL | 0 refills | Status: AC

## 2024-12-03 ENCOUNTER — Other Ambulatory Visit: Payer: Self-pay | Admitting: Adult Health

## 2024-12-03 DIAGNOSIS — F5105 Insomnia due to other mental disorder: Secondary | ICD-10-CM

## 2024-12-03 NOTE — Telephone Encounter (Signed)
 Please call pt to schedule his next appt.  He was due to come back this month.  Thank you

## 2024-12-04 ENCOUNTER — Encounter: Payer: Self-pay | Admitting: Urology

## 2024-12-04 ENCOUNTER — Ambulatory Visit: Admitting: Urology

## 2024-12-04 VITALS — BP 165/88 | HR 93 | Ht 69.0 in | Wt 155.0 lb

## 2024-12-04 DIAGNOSIS — N419 Inflammatory disease of prostate, unspecified: Secondary | ICD-10-CM | POA: Insufficient documentation

## 2024-12-04 DIAGNOSIS — N401 Enlarged prostate with lower urinary tract symptoms: Secondary | ICD-10-CM

## 2024-12-04 DIAGNOSIS — N138 Other obstructive and reflux uropathy: Secondary | ICD-10-CM

## 2024-12-04 LAB — URINALYSIS, ROUTINE W REFLEX MICROSCOPIC
Bilirubin, UA: NEGATIVE
Glucose, UA: NEGATIVE
Ketones, UA: NEGATIVE
Leukocytes,UA: NEGATIVE
Nitrite, UA: NEGATIVE
Protein,UA: NEGATIVE
Specific Gravity, UA: 1.01 (ref 1.005–1.030)
Urobilinogen, Ur: 0.2 mg/dL (ref 0.2–1.0)
pH, UA: 7 (ref 5.0–7.5)

## 2024-12-04 LAB — MICROSCOPIC EXAMINATION

## 2024-12-04 MED ORDER — ALFUZOSIN HCL ER 10 MG PO TB24: 10.0000 mg | ORAL_TABLET | Freq: Every day | ORAL | 5 refills | Status: AC

## 2024-12-04 NOTE — Telephone Encounter (Signed)
 Lvm for pt to call and schedule

## 2024-12-04 NOTE — Progress Notes (Signed)
 "  Assessment: 1. Prostatitis, unspecified prostatitis type   2. BPH with obstruction/lower urinary tract symptoms     Plan: His history and exam continue to be consistent with prostatitis. Prostate massage performed today. Labs today: CBC, BMP Recommend course of alfuzosin  10 mg daily, meloxicam 7.5 mg daily Recommend warm sitz baths Consider a course of a different antibiotic such as Bactrim  or doxycycline pending results of labs. Will arrange follow-up after review of above results.  Chief Complaint:  Chief Complaint  Patient presents with   Prostatitis    History of Present Illness:  Ryan Baker is a 59 y.o. male who is seen for continued evaluation of prostatitis symptoms.  He is followed for male hypogonadism, BPH with lower urinary tract symptoms, and history of prostatosis. He had been followed by Dr. Alfonzo with [facility] in [place].  His last visit there was in March 2024. He has a history of hypogonadism that has been managed with Axiron .  He has had good symptomatic relief with this treatment. Labs from 2/25: Testosterone  696 H&H  14.7/40.9 PSA  0.35  He continued on Axiron  4 pumps/day with good symptomatic improvement. Labs from 9/25:  Testosterone  822 H&H   14/42.7  He has a history of BPH with lower urinary tract symptoms.  This has been managed with tadalafil  7.5 mg daily. At his visit in 9/25, he reported no new urinary symptoms.  He continued with nocturia x 2, occasional frequency and urgency.  No dysuria or gross hematuria. IPSS = 7/2.  At his visit on 10/23/2024, he reported gradual worsening symptoms of prostatitis for approximately 6 weeks.  His symptoms included dysuria, frequency, urgency, decreased stream, postvoid dribbling, groin pain, pain after ejaculation and discomfort with sitting.  He was started on Cipro  500 mg twice daily x 20 days on 10/16/2024.  He noted improvement in his symptoms prior to his visit.  He was having less  discomfort in the prostate area.  He did not have pain with ejaculation. IPSS = 22/4.  He continued on Cipro  for an additional 3 weeks.  He completed the antibiotics about 2 weeks ago.  He was feeling better for a few days.  He has since noted worsening of his symptoms with dysuria, frequency, and scrotal and groin pain.  He continues to have discomfort in the perineum with sitting.  No scrotal swelling or redness. He reportedly tried a dose of alfuzosin  and noted improvement in his symptoms.  No fever or chills. IPSS = 14/4.  Portions of the above documentation were copied from a prior visit for review purposes only.  Past Medical History:  Past Medical History:  Diagnosis Date   Anxiety    generalized anxiety disorder   Asthma    Coronary artery disease    Depression    Eczema of scalp    Enlarged prostate    Family history of anesthesia complication    Father vomits   GERD (gastroesophageal reflux disease)    Hyperlipemia    Prostatitis     Past Surgical History:  Past Surgical History:  Procedure Laterality Date   CORONARY ANGIOPLASTY     sept 2013   CORONARY ANGIOPLASTY WITH STENT PLACEMENT     ESOPHAGOGASTRODUODENOSCOPY     SHOULDER ARTHROSCOPY Left 08/15/2014   Procedure: LEFT ARTHROSCOPY SHOULDER WITH ACROMIO CLAVICULAR RESECTION;  Surgeon: Elspeth JONELLE Her, MD;  Location: [facility] OR;  Service: Orthopedics;  Laterality: Left;   VASECTOMY     WISDOM TOOTH EXTRACTION  Allergies:  Allergies  Allergen Reactions   Amoxicillin Diarrhea   Flomax [Tamsulosin Hcl] Palpitations   Flomax [Tamsulosin Hcl] Palpitations   Tamsulosin Palpitations    Family History:  Family History  Problem Relation Age of Onset   Anxiety disorder Mother    Hypertension Father    Allergic rhinitis Sister    Hypertension Other    Heart disease Other    Angioedema Neg Hx    Asthma Neg Hx    Eczema Neg Hx    Immunodeficiency Neg Hx    Urticaria Neg Hx     Social History:  Social  History   Tobacco Use   Smoking status: Never   Smokeless tobacco: Never  Vaping Use   Vaping status: Never Used  Substance Use Topics   Alcohol use: No    Comment: social- rare   Drug use: No    ROS: Constitutional:  Negative for fever, chills, weight loss CV: Negative for chest pain, previous MI, hypertension Respiratory:  Negative for shortness of breath, wheezing, sleep apnea, frequent cough GI:  Negative for nausea, vomiting, bloody stool, GERD   Physical exam: BP (!) 165/88   Pulse 93   Ht 5' 9 (1.753 m)   Wt 155 lb (70.3 kg)   BMI 22.89 kg/m  GENERAL APPEARANCE:  Well appearing, well developed, well nourished, NAD HEENT:  Atraumatic, normocephalic, oropharynx clear NECK:  Supple without lymphadenopathy or thyromegaly ABDOMEN:  Soft, non-tender, no masses EXTREMITIES:  Moves all extremities well, without clubbing, cyanosis, or edema NEUROLOGIC:  Alert and oriented x 3, normal gait, CN II-XII grossly intact MENTAL STATUS:  appropriate BACK:  Non-tender to palpation, No CVAT SKIN:  Warm, dry, and intact GU: Scrotum: No erythema or edema Testis: normal without masses bilateral Epididymis: normal Prostate: 30 g, moderately tender to palpation, no nodules; prostate massage performed Rectum: Normal tone,  no masses or tenderness  Results: U/A: 0-5 WBCs, 0-2 RBCs "

## 2024-12-05 ENCOUNTER — Ambulatory Visit: Payer: Self-pay | Admitting: Urology

## 2024-12-05 DIAGNOSIS — N419 Inflammatory disease of prostate, unspecified: Secondary | ICD-10-CM

## 2024-12-05 LAB — BASIC METABOLIC PANEL WITH GFR
BUN/Creatinine Ratio: 19 (ref 9–20)
BUN: 17 mg/dL (ref 6–24)
CO2: 23 mmol/L (ref 20–29)
Calcium: 9.7 mg/dL (ref 8.7–10.2)
Chloride: 91 mmol/L — ABNORMAL LOW (ref 96–106)
Creatinine, Ser: 0.88 mg/dL (ref 0.76–1.27)
Glucose: 102 mg/dL — ABNORMAL HIGH (ref 70–99)
Potassium: 4.4 mmol/L (ref 3.5–5.2)
Sodium: 129 mmol/L — ABNORMAL LOW (ref 134–144)
eGFR: 99 mL/min/1.73

## 2024-12-05 LAB — CBC
Hematocrit: 38.7 % (ref 37.5–51.0)
Hemoglobin: 13.7 g/dL (ref 13.0–17.7)
MCH: 33.4 pg — ABNORMAL HIGH (ref 26.6–33.0)
MCHC: 35.4 g/dL (ref 31.5–35.7)
MCV: 94 fL (ref 79–97)
Platelets: 235 x10E3/uL (ref 150–450)
RBC: 4.1 x10E6/uL — ABNORMAL LOW (ref 4.14–5.80)
RDW: 11.8 % (ref 11.6–15.4)
WBC: 5.5 x10E3/uL (ref 3.4–10.8)

## 2024-12-05 MED ORDER — SULFAMETHOXAZOLE-TRIMETHOPRIM 800-160 MG PO TABS: 1.0000 | ORAL_TABLET | Freq: Two times a day (BID) | ORAL | 0 refills | Status: AC

## 2024-12-10 ENCOUNTER — Encounter: Payer: Self-pay | Admitting: Urology

## 2024-12-13 NOTE — Telephone Encounter (Signed)
Pt has appt 2/25

## 2024-12-24 ENCOUNTER — Encounter: Payer: Self-pay | Admitting: Urology

## 2024-12-24 ENCOUNTER — Other Ambulatory Visit: Payer: Self-pay | Admitting: Urology

## 2024-12-24 MED ORDER — SULFAMETHOXAZOLE-TRIMETHOPRIM 800-160 MG PO TABS: 1.0000 | ORAL_TABLET | Freq: Two times a day (BID) | ORAL | 0 refills | Status: AC

## 2025-01-15 ENCOUNTER — Ambulatory Visit: Admitting: Psychiatry

## 2025-01-30 ENCOUNTER — Ambulatory Visit: Admitting: Urology
